# Patient Record
Sex: Male | Born: 1955 | Race: White | Hispanic: No | Marital: Married | Smoking: Former smoker
Health system: Southern US, Community
[De-identification: ages and names within clinical notes are randomized; demographics above are authoritative.]

## PROBLEM LIST (undated history)

## (undated) DIAGNOSIS — G629 Polyneuropathy, unspecified: Secondary | ICD-10-CM

## (undated) DIAGNOSIS — H349 Unspecified retinal vascular occlusion: Secondary | ICD-10-CM

## (undated) DIAGNOSIS — E669 Obesity, unspecified: Secondary | ICD-10-CM

## (undated) DIAGNOSIS — I10 Essential (primary) hypertension: Secondary | ICD-10-CM

## (undated) DIAGNOSIS — Z720 Tobacco use: Secondary | ICD-10-CM

## (undated) DIAGNOSIS — M51369 Other intervertebral disc degeneration, lumbar region without mention of lumbar back pain or lower extremity pain: Secondary | ICD-10-CM

## (undated) DIAGNOSIS — D72829 Elevated white blood cell count, unspecified: Secondary | ICD-10-CM

## (undated) DIAGNOSIS — E785 Hyperlipidemia, unspecified: Secondary | ICD-10-CM

## (undated) DIAGNOSIS — E7800 Pure hypercholesterolemia, unspecified: Secondary | ICD-10-CM

## (undated) DIAGNOSIS — M179 Osteoarthritis of knee, unspecified: Secondary | ICD-10-CM

## (undated) HISTORY — DX: Elevated white blood cell count, unspecified: D72.829

## (undated) HISTORY — DX: Hyperlipidemia, unspecified: E78.5

## (undated) HISTORY — DX: Unspecified retinal vascular occlusion: H34.9

## (undated) HISTORY — PX: BACK SURGERY: SHX140

## (undated) HISTORY — DX: Polyneuropathy, unspecified: G62.9

## (undated) HISTORY — DX: Obesity, unspecified: E66.9

## (undated) HISTORY — DX: Tobacco use: Z72.0

## (undated) HISTORY — DX: Other intervertebral disc degeneration, lumbar region without mention of lumbar back pain or lower extremity pain: M51.369

## (undated) HISTORY — DX: Osteoarthritis of knee, unspecified: M17.9

---

## 2018-03-07 ENCOUNTER — Encounter: Payer: Self-pay | Admitting: Emergency Medicine

## 2018-03-07 ENCOUNTER — Emergency Department: Payer: 59

## 2018-03-07 ENCOUNTER — Other Ambulatory Visit: Payer: Self-pay

## 2018-03-07 ENCOUNTER — Observation Stay
Admission: EM | Admit: 2018-03-07 | Discharge: 2018-03-10 | Disposition: A | Discharge status: Home / Self Care | Payer: 59 | Attending: Internal Medicine | Admitting: Internal Medicine

## 2018-03-07 DIAGNOSIS — G629 Polyneuropathy, unspecified: Secondary | ICD-10-CM | POA: Diagnosis not present

## 2018-03-07 DIAGNOSIS — F17200 Nicotine dependence, unspecified, uncomplicated: Secondary | ICD-10-CM | POA: Diagnosis not present

## 2018-03-07 DIAGNOSIS — Z79899 Other long term (current) drug therapy: Secondary | ICD-10-CM | POA: Insufficient documentation

## 2018-03-07 DIAGNOSIS — I10 Essential (primary) hypertension: Secondary | ICD-10-CM | POA: Diagnosis not present

## 2018-03-07 DIAGNOSIS — Z7982 Long term (current) use of aspirin: Secondary | ICD-10-CM | POA: Diagnosis not present

## 2018-03-07 DIAGNOSIS — G459 Transient cerebral ischemic attack, unspecified: Principal | ICD-10-CM | POA: Diagnosis present

## 2018-03-07 DIAGNOSIS — H547 Unspecified visual loss: Secondary | ICD-10-CM | POA: Diagnosis present

## 2018-03-07 LAB — CBC
HCT: 43.6 % (ref 39.0–52.0)
Hemoglobin: 14.9 g/dL (ref 13.0–17.0)
MCH: 32.7 pg (ref 26.0–34.0)
MCHC: 34.2 g/dL (ref 30.0–36.0)
MCV: 95.6 fL (ref 80.0–100.0)
PLATELETS: 308 10*3/uL (ref 150–400)
RBC: 4.56 MIL/uL (ref 4.22–5.81)
RDW: 12.5 % (ref 11.5–15.5)
WBC: 12.2 10*3/uL — ABNORMAL HIGH (ref 4.0–10.5)
nRBC: 0 % (ref 0.0–0.2)

## 2018-03-07 LAB — COMPREHENSIVE METABOLIC PANEL
ALT: 36 U/L (ref 0–44)
ANION GAP: 9 (ref 5–15)
AST: 23 U/L (ref 15–41)
Albumin: 3.9 g/dL (ref 3.5–5.0)
Alkaline Phosphatase: 52 U/L (ref 38–126)
BUN: 25 mg/dL — ABNORMAL HIGH (ref 8–23)
CHLORIDE: 102 mmol/L (ref 98–111)
CO2: 28 mmol/L (ref 22–32)
Calcium: 9.3 mg/dL (ref 8.9–10.3)
Creatinine, Ser: 0.89 mg/dL (ref 0.61–1.24)
GFR calc non Af Amer: >60 mL/min (ref >60)
Glucose, Bld: 113 mg/dL — ABNORMAL HIGH (ref 70–99)
POTASSIUM: 3.9 mmol/L (ref 3.5–5.1)
SODIUM: 139 mmol/L (ref 135–145)
Total Bilirubin: 0.7 mg/dL (ref 0.3–1.2)
Total Protein: 7.3 g/dL (ref 6.5–8.1)

## 2018-03-07 MED ORDER — ONDANSETRON HCL 4 MG PO TABS: 4.0000 mg | ORAL_TABLET | Freq: Four times a day (QID) | ORAL | Status: DC | PRN

## 2018-03-07 MED ORDER — PREGABALIN 50 MG PO CAPS
100.0000 mg | ORAL_CAPSULE | Freq: Three times a day (TID) | ORAL | Status: DC
  Administered 2018-03-08 – 2018-03-10 (×7): 100 mg via ORAL
  Filled 2018-03-07 (×7): qty 2

## 2018-03-07 MED ORDER — DOCUSATE SODIUM 100 MG PO CAPS
100.0000 mg | ORAL_CAPSULE | Freq: Two times a day (BID) | ORAL | Status: DC
  Administered 2018-03-09: 10:00:00 100 mg via ORAL
  Filled 2018-03-07 (×6): qty 1

## 2018-03-07 MED ORDER — ASPIRIN 325 MG PO TABS
325.0000 mg | ORAL_TABLET | Freq: Every day | ORAL | Status: DC
  Administered 2018-03-08 – 2018-03-09 (×2): 325 mg via ORAL
  Filled 2018-03-07 (×2): qty 1

## 2018-03-07 MED ORDER — ACETAMINOPHEN 650 MG RE SUPP: 650.0000 mg | Freq: Four times a day (QID) | RECTAL | Status: DC | PRN

## 2018-03-07 MED ORDER — ACETAMINOPHEN 325 MG PO TABS: 650.0000 mg | ORAL_TABLET | Freq: Four times a day (QID) | ORAL | Status: DC | PRN

## 2018-03-07 MED ORDER — HEPARIN SODIUM (PORCINE) 5000 UNIT/ML IJ SOLN
5000.0000 [IU] | Freq: Three times a day (TID) | INTRAMUSCULAR | Status: DC
  Administered 2018-03-08 – 2018-03-10 (×6): 5000 [IU] via SUBCUTANEOUS
  Filled 2018-03-07 (×6): qty 1

## 2018-03-07 MED ORDER — VITAMIN C 500 MG PO TABS
500.0000 mg | ORAL_TABLET | Freq: Every day | ORAL | Status: DC
  Administered 2018-03-08 – 2018-03-10 (×3): 500 mg via ORAL
  Filled 2018-03-07 (×3): qty 1

## 2018-03-07 MED ORDER — HYDROCODONE-ACETAMINOPHEN 5-325 MG PO TABS: 1.0000 | ORAL_TABLET | ORAL | Status: DC | PRN

## 2018-03-07 MED ORDER — BISACODYL 5 MG PO TBEC: 5.0000 mg | DELAYED_RELEASE_TABLET | Freq: Every day | ORAL | Status: DC | PRN

## 2018-03-07 MED ORDER — ONDANSETRON HCL 4 MG/2ML IJ SOLN: 4.0000 mg | Freq: Four times a day (QID) | INTRAMUSCULAR | Status: DC | PRN

## 2018-03-07 NOTE — ED Notes (Signed)
Patient transported to CT at this time. 

## 2018-03-07 NOTE — ED Triage Notes (Signed)
Pt presents to ED with vision loss in his right eye. Pt states he got home from work around 1730 and felt tired so he was rubbing his eyes while sitting in his recliner. Pt reports when he stopped he was unable to see out of his right eye. States there is a very small "match head sized area that is clear" but the rest of his visual field is very blurry. Pt reports "it could be clearing up a little bit" and seems to have improved slightly since onset. No other complaints or deficits at this time.

## 2018-03-07 NOTE — ED Provider Notes (Signed)
[facility] Emergency Department Provider Note   ____________________________________________   First MD Initiated Contact with Patient 03/07/18 1936     (approximate)  I have reviewed the triage vital signs and the nursing notes.   HISTORY  Chief Complaint Loss of Vision    HPI Greg Poole is a 62 y.o. malen patient reports about 530 he was rubbing his eye and when he stopped he could not see anything out of the eye except for very small area in the center.  This is gradually began to clear up and now is having just gray spots all around his vision and can only see really well in the center.  He denies any medical problems although review of his old records show that he has hypertension peripheral neuropathy.  He says he is taking his medicines and to take his aspirin 325 daily.   History reviewed. No pertinent past medical history.  There are no active problems to display for this patient.   Past Surgical History:  Procedure Laterality Date  . BACK SURGERY      Prior to Admission medications   Not on File    Allergies Patient has no known allergies.  No family history on file.  Social History Social History   Tobacco Use  . Smoking status: Current Every Day Smoker  . Smokeless tobacco: Never Used  Substance Use Topics  . Alcohol use: Yes  . Drug use: Never    Review of Systems  Constitutional: No fever/chills Eyes:  visual changes. ENT: No sore throat. Cardiovascular: Denies chest pain. Respiratory: Denies shortness of breath. Gastrointestinal: No abdominal pain.  No nausea, no vomiting.  No diarrhea.  No constipation. Genitourinary: Negative for dysuria. Musculoskeletal: Negative for back pain. Skin: Negative for rash. Neurological: Negative for headaches, focal weakness   ____________________________________________   PHYSICAL EXAM:  VITAL SIGNS: ED Triage Vitals  Enc Vitals Group     BP 03/07/18 1922 127/76   Pulse Rate 03/07/18 1922 (!) 56     Resp 03/07/18 1922 20     Temp 03/07/18 1922 (!) 97.5 F (36.4 C)     Temp Source 03/07/18 1922 Oral     SpO2 03/07/18 1922 95 %     Weight 03/07/18 1923 215 lb (97.5 kg)     Height 03/07/18 1923 6' (1.829 m)     Head Circumference --      Peak Flow --      Pain Score 03/07/18 1922 0     Pain Loc --      Pain Edu? --      Excl. in GC? --     Constitutional: Alert and oriented. Well appearing and in no acute distress. Eyes: Conjunctivae are normal. PERRL. EOMI. fundi look normal as well. Head: Atraumatic. Nose: No congestion/rhinnorhea. Mouth/Throat: Mucous membranes are moist.  Oropharynx non-erythematous. Neck: No stridor. Cardiovascular: Normal rate, regular rhythm. Grossly normal heart sounds.  Good peripheral circulation. Respiratory: Normal respiratory effort.  No retractions. Lungs CTAB. Gastrointestinal: Soft and nontender. No distention. No abdominal bruits. No CVA tenderness. Musculoskeletal: No lower extremity tenderness nor edema.   Neurologic:  Normal speech and language. No gross focal neurologic deficits are appreciated structure movements are intact there is no new numbness and no weakness weakness Skin:  Skin is warm, dry and intact. No rash noted. Psychiatric: Mood and affect are normal. Speech and behavior are normal.  ____________________________________________   LABS (all labs ordered are listed, but only abnormal results are  displayed)  Labs Reviewed  CBC - Abnormal; Notable for the following components:      Result Value   WBC 12.2 (*)    All other components within normal limits  COMPREHENSIVE METABOLIC PANEL - Abnormal; Notable for the following components:   Glucose, Bld 113 (*)    BUN 25 (*)    All other components within normal limits   ____________________________________________  EKG  EKG read interpreted by me shows this bradycardia rate of 52 normal axis no acute ST-T  changes ____________________________________________  RADIOLOGY  ED MD interpretation: CT read by radiology does not show any cause for the patient's symptoms  Official radiology report(s): Dg Chest 2 View  Result Date: 03/07/2018 CLINICAL DATA:  Vision loss in right eye since 5 p.m. EXAM: CHEST - 2 VIEW COMPARISON:  None. FINDINGS: The heart size and mediastinal contours are within normal limits. Both lungs are clear. The visualized skeletal structures are unremarkable. IMPRESSION: No active cardiopulmonary disease. Electronically Signed   By: Gerome Sam III M.D   On: 03/07/2018 20:03   Ct Head Wo Contrast  Result Date: 03/07/2018 CLINICAL DATA:  Visual loss in the right eye. EXAM: CT HEAD WITHOUT CONTRAST TECHNIQUE: Contiguous axial images were obtained from the base of the skull through the vertex without intravenous contrast. COMPARISON:  None. FINDINGS: Brain: No evidence of acute infarction, hemorrhage, hydrocephalus, extra-axial collection or mass lesion/mass effect. Minimal small vessel ischemic disease of periventricular white matter, age indeterminate given lack of prior studies for comparison. Vascular: No hyperdense vessel or unexpected calcification. Skull: Normal. Negative for fracture or focal lesion. Sinuses/Orbits: No acute finding. The globes are incompletely imaged on this head CT. That which is included is unremarkable. Other: None. IMPRESSION: Minimal small vessel ischemic disease periventricular white matter. No acute intracranial abnormality. No CT findings to explain the patient's right-sided visual loss. Electronically Signed   By: Tollie Eth M.D.   On: 03/07/2018 20:21    ____________________________________________   PROCEDURES  Procedure(s) performed:   Procedures  Critical Care performed:   ____________________________________________   INITIAL IMPRESSION / ASSESSMENT AND PLAN / ED COURSE  Patient with apparent amaurosis fugax.  This is  essentially a TIA we will admit him.  Is already taken aspirin today.         ____________________________________________   FINAL CLINICAL IMPRESSION(S) / ED DIAGNOSES  Final diagnoses:  TIA (transient ischemic attack)     ED Discharge Orders    None       Note:  This document was prepared using Dragon voice recognition software and may include unintentional dictation errors.     Arnaldo Natal, MD 03/07/18 2105

## 2018-03-07 NOTE — ED Notes (Signed)
Pt reports already taking his daily aspirin for today, so additional dose not indicated at this time.

## 2018-03-07 NOTE — ED Notes (Signed)
Pt returned to ED Rm 19 from CT at this time. 

## 2018-03-07 NOTE — H&P (Signed)
[facility] [place] [facility]   PATIENT NAME: Greg AndersonRobert Poole    MR#:  [id number]  DATE OF BIRTH:  07-04-1955  DATE OF ADMISSION:  03/07/2018  PRIMARY CARE PHYSICIAN: Patient, No Pcp Per   REQUESTING/REFERRING PHYSICIAN:   CHIEF COMPLAINT:   Chief Complaint  Patient presents with  . Loss of Vision    HISTORY OF PRESENT ILLNESS: Greg Poole  is a 62 y.o. male with a known history of tobacco abuse, hypertension, peripheral neuropathy. He presented to the hospital for sudden onset of painless vision loss to the right eye.  He states that around 5:30 PM, after he was rubbing his eye, he could not see with the right eye except for a very small area in the center.  This has gradually began to clear up and now patient is only having a small gray spot medial to the center of the eye.  No reports of headache, weakness, numbness or tingling.  No similar episodes in the past.  He is compliant with his medications including blood pressure medications and aspirin daily.  No new medications, no illicit drugs use.  He has been smoking daily for approximately 40 years. Brain CT done in emergency room is negative for any acute findings. Blood test done emergency room, including CBC and CMP are grossly unremarkable, except for slightly elevated WBC at 12.2. Is admitted for further evaluation and treatment.  PAST MEDICAL HISTORY: Hypertension, peripheral neuropathy, tobacco abuse.  PAST SURGICAL HISTORY:  Past Surgical History:  Procedure Laterality Date  . BACK SURGERY      SOCIAL HISTORY:  Social History   Tobacco Use  . Smoking status: Current Every Day Smoker  . Smokeless tobacco: Never Used  Substance Use Topics  . Alcohol use: Yes    FAMILY HISTORY: HTN in both parents.  DRUG ALLERGIES: No Known Allergies  REVIEW OF SYSTEMS:   CONSTITUTIONAL: No fever, fatigue or weakness.  EYES: Right eye vision loss.  EARS, NOSE, AND THROAT: No tinnitus or ear  pain.  RESPIRATORY: No cough, shortness of breath, wheezing or hemoptysis.  CARDIOVASCULAR: No chest pain, orthopnea, edema.  GASTROINTESTINAL: No nausea, vomiting, diarrhea or abdominal pain.  GENITOURINARY: No dysuria, hematuria.  ENDOCRINE: No polyuria, nocturia. HEMATOLOGY: No bleeding. SKIN: No rash or lesion. MUSCULOSKELETAL: No joint pain at this time.   NEUROLOGIC: No focal weakness.  PSYCHIATRY: No anxiety or depression.   MEDICATIONS AT HOME:  Prior to Admission medications   Medication Sig Start Date End Date Taking? Authorizing Provider  amLODipine (NORVASC) 5 MG tablet Take 5 mg by mouth daily.   Yes [provider]  aspirin 325 MG tablet Take 1 tablet by mouth daily.   Yes [provider]  bisoprolol-hydrochlorothiazide (ZIAC) 5-6.25 MG tablet Take 1 tablet by mouth daily.   Yes [provider]  vitamin C (ASCORBIC ACID) 500 MG tablet Take 500 mg by mouth daily.   Yes [provider]  pregabalin (LYRICA) 100 MG capsule Take 100 mg by mouth 3 (three) times daily. 02/09/18   [provider]      PHYSICAL EXAMINATION:   VITAL SIGNS: Blood pressure 126/70, pulse (!) 52, temperature (!) 97.5 F (36.4 C), temperature source Oral, resp. rate 17, height 6' (1.829 m), weight 97.5 kg, SpO2 97 %.  GENERAL:  62 y.o.-year-old patient lying in the bed with no acute distress.  EYES: Pupils equal, round, reactive to light and accommodation. No scleral icterus. Extraocular muscles intact.  HEENT: Head  atraumatic, normocephalic. Oropharynx and nasopharynx clear.  NECK:  Supple, no jugular venous distention. No thyroid enlargement, no tenderness.  LUNGS: Normal breath sounds bilaterally, no wheezing, rales,rhonchi or crepitation. No use of accessory muscles of respiration.  CARDIOVASCULAR: S1, S2 normal. No S3/S4.  ABDOMEN: Soft, nontender, nondistended. Bowel sounds present. No organomegaly or mass.  EXTREMITIES: No pedal edema, cyanosis, or  clubbing.  NEUROLOGIC: Cranial nerves II through XII are intact. Muscle strength 5/5 in all extremities. Sensation intact.   PSYCHIATRIC: The patient is alert and oriented x 3.  SKIN: No obvious rash, lesion, or ulcer.   LABORATORY PANEL:   CBC Recent Labs  Lab 03/07/18 2007  WBC 12.2*  HGB 14.9  HCT 43.6  PLT 308  MCV 95.6  MCH 32.7  MCHC 34.2  RDW 12.5   ------------------------------------------------------------------------------------------------------------------  Chemistries  Recent Labs  Lab 03/07/18 2007  NA 139  K 3.9  CL 102  CO2 28  GLUCOSE 113*  BUN 25*  CREATININE 0.89  CALCIUM 9.3  AST 23  ALT 36  ALKPHOS 52  BILITOT 0.7   ------------------------------------------------------------------------------------------------------------------ estimated creatinine clearance is 105.5 mL/min (by C-G formula based on SCr of 0.89 mg/dL). ------------------------------------------------------------------------------------------------------------------ No results for input(s): TSH, T4TOTAL, T3FREE, THYROIDAB in the last 72 hours.  Invalid input(s): FREET3   Coagulation profile No results for input(s): INR, PROTIME in the last 168 hours. ------------------------------------------------------------------------------------------------------------------- No results for input(s): DDIMER in the last 72 hours. -------------------------------------------------------------------------------------------------------------------  Cardiac Enzymes No results for input(s): CKMB, TROPONINI, MYOGLOBIN in the last 168 hours.  Invalid input(s): CK ------------------------------------------------------------------------------------------------------------------ Invalid input(s): POCBNP  ---------------------------------------------------------------------------------------------------------------  Urinalysis No results found for: COLORURINE, APPEARANCEUR, LABSPEC, PHURINE,  GLUCOSEU, HGBUR, BILIRUBINUR, KETONESUR, PROTEINUR, UROBILINOGEN, NITRITE, LEUKOCYTESUR   RADIOLOGY: Dg Chest 2 View  Result Date: 03/07/2018 CLINICAL DATA:  Vision loss in right eye since 5 p.m. EXAM: CHEST - 2 VIEW COMPARISON:  None. FINDINGS: The heart size and mediastinal contours are within normal limits. Both lungs are clear. The visualized skeletal structures are unremarkable. IMPRESSION: No active cardiopulmonary disease. Electronically Signed   By: Gerome Sam III M.D   On: 03/07/2018 20:03   Ct Head Wo Contrast  Result Date: 03/07/2018 CLINICAL DATA:  Visual loss in the right eye. EXAM: CT HEAD WITHOUT CONTRAST TECHNIQUE: Contiguous axial images were obtained from the base of the skull through the vertex without intravenous contrast. COMPARISON:  None. FINDINGS: Brain: No evidence of acute infarction, hemorrhage, hydrocephalus, extra-axial collection or mass lesion/mass effect. Minimal small vessel ischemic disease of periventricular white matter, age indeterminate given lack of prior studies for comparison. Vascular: No hyperdense vessel or unexpected calcification. Skull: Normal. Negative for fracture or focal lesion. Sinuses/Orbits: No acute finding. The globes are incompletely imaged on this head CT. That which is included is unremarkable. Other: None. IMPRESSION: Minimal small vessel ischemic disease periventricular white matter. No acute intracranial abnormality. No CT findings to explain the patient's right-sided visual loss. Electronically Signed   By: Tollie Eth M.D.   On: 03/07/2018 20:21    EKG: Orders placed or performed during the hospital encounter of 03/07/18  . ED EKG  . ED EKG  . EKG 12-Lead  . EKG 12-Lead  . EKG 12-Lead  . EKG 12-Lead  . EKG 12-Lead  . EKG 12-Lead    IMPRESSION AND PLAN:   1.  Right eye transient, partial vision loss.  Will rule out TIA.  Continue aspirin.  Will check brain MRI, carotid Doppler and 2D echo.  Will check lipid panel  and  A1c. 2.  HTN, well-controlled, resume home medications. 3.  Tobacco abuse and dependence.  Smoking cessation was discussed with patient in detail. 4.  Peripheral neuropathy, on Lyrica.  All the records are reviewed and case discussed with ED provider. Management plans discussed with the patient, family and they are in agreement.  CODE STATUS: Full    TOTAL TIME TAKING CARE OF THIS PATIENT:50 minutes.    Cammy Copa M.D on 03/07/2018 at 11:06 PM  Between 7am to 6pm - Pager - [phone]  After 6pm go to [email or web address] - password [email or web address] [facility] [place] [facility]  [phone]  CC: Primary care physician; Patient, No Pcp Per

## 2018-03-08 ENCOUNTER — Other Ambulatory Visit: Payer: Self-pay

## 2018-03-08 ENCOUNTER — Inpatient Hospital Stay: Payer: 59

## 2018-03-08 ENCOUNTER — Inpatient Hospital Stay
Admit: 2018-03-08 | Discharge: 2018-03-08 | Disposition: A | Discharge status: Home / Self Care | Payer: 59 | Attending: Internal Medicine | Admitting: Internal Medicine

## 2018-03-08 DIAGNOSIS — G459 Transient cerebral ischemic attack, unspecified: Secondary | ICD-10-CM | POA: Diagnosis not present

## 2018-03-08 LAB — CBC
HCT: 41.2 % (ref 39.0–52.0)
HEMOGLOBIN: 13.9 g/dL (ref 13.0–17.0)
MCH: 32.5 pg (ref 26.0–34.0)
MCHC: 33.7 g/dL (ref 30.0–36.0)
MCV: 96.3 fL (ref 80.0–100.0)
Platelets: 277 10*3/uL (ref 150–400)
RBC: 4.28 MIL/uL (ref 4.22–5.81)
RDW: 12.5 % (ref 11.5–15.5)
WBC: 9 10*3/uL (ref 4.0–10.5)
nRBC: 0 % (ref 0.0–0.2)

## 2018-03-08 LAB — LIPID PANEL
Cholesterol: 202 mg/dL — ABNORMAL HIGH (ref 0–200)
HDL: 48 mg/dL (ref >40)
LDL CALC: 132 mg/dL — AB (ref 0–99)
Total CHOL/HDL Ratio: 4.2 RATIO
Triglycerides: 111 mg/dL (ref <150)
VLDL: 22 mg/dL (ref 0–40)

## 2018-03-08 LAB — BASIC METABOLIC PANEL
ANION GAP: 10 (ref 5–15)
BUN: 19 mg/dL (ref 8–23)
CHLORIDE: 106 mmol/L (ref 98–111)
CO2: 24 mmol/L (ref 22–32)
Calcium: 8.8 mg/dL — ABNORMAL LOW (ref 8.9–10.3)
Creatinine, Ser: 0.56 mg/dL — ABNORMAL LOW (ref 0.61–1.24)
GFR calc Af Amer: >60 mL/min (ref >60)
GLUCOSE: 102 mg/dL — AB (ref 70–99)
POTASSIUM: 3.9 mmol/L (ref 3.5–5.1)
SODIUM: 140 mmol/L (ref 135–145)

## 2018-03-08 LAB — GLUCOSE, CAPILLARY: GLUCOSE-CAPILLARY: 108 mg/dL — AB (ref 70–99)

## 2018-03-08 LAB — HEMOGLOBIN A1C
Hgb A1c MFr Bld: 5.4 % (ref 4.8–5.6)
Mean Plasma Glucose: 108.28 mg/dL

## 2018-03-08 LAB — VITAMIN B12: VITAMIN B 12: 995 pg/mL — AB (ref 180–914)

## 2018-03-08 LAB — TSH: TSH: 2.11 u[IU]/mL (ref 0.350–4.500)

## 2018-03-08 MED ORDER — CLOPIDOGREL BISULFATE 75 MG PO TABS: 75.0000 mg | ORAL_TABLET | Freq: Every day | ORAL | 0 refills | Status: AC

## 2018-03-08 MED ORDER — ATORVASTATIN CALCIUM 20 MG PO TABS
40.0000 mg | ORAL_TABLET | Freq: Every day | ORAL | Status: DC
  Administered 2018-03-08 – 2018-03-09 (×2): 40 mg via ORAL
  Filled 2018-03-08 (×2): qty 2

## 2018-03-08 MED ORDER — HALOPERIDOL LACTATE 5 MG/ML IJ SOLN: 2.0000 mg | Freq: Four times a day (QID) | INTRAMUSCULAR | Status: DC | PRN

## 2018-03-08 MED ORDER — ATORVASTATIN CALCIUM 40 MG PO TABS: 40.0000 mg | ORAL_TABLET | Freq: Every day | ORAL | 0 refills | Status: DC

## 2018-03-08 NOTE — Plan of Care (Signed)
  Problem: Education: Goal: Knowledge of General Education information will improve Description Including pain rating scale, medication(s)/side effects and non-pharmacologic comfort measures Outcome: Progressing   Problem: Elimination: Goal: Will not experience complications related to urinary retention Outcome: Progressing   Problem: Pain Managment: Goal: General experience of comfort will improve Outcome: Progressing   Problem: Safety: Goal: Ability to remain free from injury will improve Outcome: Progressing   Problem: Education: Goal: Knowledge of disease or condition will improve Outcome: Progressing   Problem: Ischemic Stroke/TIA Tissue Perfusion: Goal: Complications of ischemic stroke/TIA will be minimized Outcome: Progressing

## 2018-03-08 NOTE — Discharge Summary (Deleted)
[facility] [place] [facility]   PATIENT NAME: Greg Poole    MR#:  [id number]  DATE OF BIRTH:  Jul 05, 1955  DATE OF ADMISSION:  03/07/2018   ADMITTING PHYSICIAN: Greg Copa, MD  DATE OF DISCHARGE: 03/08/18  PRIMARY CARE PHYSICIAN: Greg Poole   ADMISSION DIAGNOSIS:  TIA (transient ischemic attack) [G45.9] DISCHARGE DIAGNOSIS:  Active Problems:   TIA (transient ischemic attack)  SECONDARY DIAGNOSIS:  History reviewed. No pertinent past medical history. HOSPITAL COURSE:   Greg Poole is a 62 year old male with no past medical history who presented to the ED with painless right eye vision loss.  He described the vision loss as "not being able to see except for a small pinpoint spot in the middle of his eye".  He was admitted for stroke rule out.  Painless right eye vision loss- due to TIA vs ophthalmologic issue. -Initially unable to see anything out of right eye, except for pinpoint area in the center. Vision improved on the day of discharge and patient now just having small grey spots throughout visual field -MRI brain negative for acute stroke -Carotid dopplers unremarkable -ECHO ** -A1c 5.4%, LDL elevated to 132 -Started on Lipitor 40mg  daily -Seen by neurology, who recommended switching from aspirin to plavix -Urgent outpatient follow-up appointment scheduled with ophthalmology  Hypertension- BPs well-controlled -Continued home BP meds  Tobacco use- smoking cessation counseling provided  Peripheral neuropathy- continued lyrica  DISCHARGE CONDITIONS:  Painless right eye vision loss Hypertension Tobacco use Peripheral neuropathy CONSULTS OBTAINED:  Treatment Team:  Greg Groom, MD DRUG ALLERGIES:  No Known Allergies DISCHARGE MEDICATIONS:   Allergies as of 03/08/2018   No Known Allergies     Medication List    STOP taking these medications   aspirin 325 MG tablet     TAKE these medications   amLODipine 5 MG  tablet Commonly known as:  NORVASC Take 5 mg by mouth daily.   atorvastatin 40 MG tablet Commonly known as:  LIPITOR Take 1 tablet (40 mg total) by mouth daily at 6 PM.   bisoprolol-hydrochlorothiazide 5-6.25 MG tablet Commonly known as:  ZIAC Take 1 tablet by mouth daily.   clopidogrel 75 MG tablet Commonly known as:  PLAVIX Take 1 tablet (75 mg total) by mouth daily.   pregabalin 100 MG capsule Commonly known as:  LYRICA Take 100 mg by mouth 3 (three) times daily.   vitamin C 500 MG tablet Commonly known as:  ASCORBIC ACID Take 500 mg by mouth daily.        DISCHARGE INSTRUCTIONS:  1.  Follow-up with PCP in 1 to 2 weeks 2.  Follow-up with ophthalmology in the next couple of days 3.  Switched from aspirin to Plavix Poole neurology recommendations 4.  Started on Lipitor DIET:  Cardiac diet DISCHARGE CONDITION:  Stable ACTIVITY:  Activity as tolerated OXYGEN:  Home Oxygen: Yes.    Oxygen Delivery: room air DISCHARGE LOCATION:  home   If you experience worsening of your admission symptoms, develop shortness of breath, life threatening emergency, suicidal or homicidal thoughts you must seek medical attention immediately by calling 911 or calling your MD immediately  if symptoms less severe.  You Must read complete instructions/literature along with all the possible adverse reactions/side effects for all the Medicines you take and that have been prescribed to you. Take any new Medicines after you have completely understood and accpet all the possible adverse reactions/side effects.   Please note  You were cared for  by a hospitalist during your hospital stay. If you have any questions about your discharge medications or the care you received while you were in the hospital after you are discharged, you can call the unit and asked to speak with the hospitalist on call if the hospitalist that took care of you is not available. Once you are discharged, your primary care  physician will handle any further medical issues. Please note that NO REFILLS for any discharge medications will be authorized once you are discharged, as it is imperative that you return to your primary care physician (or establish a relationship with a primary care physician if you do not have one) for your aftercare needs so that they can reassess your need for medications and monitor your lab values.    On the day of Discharge:  VITAL SIGNS:  Blood pressure (!) 150/79, pulse 62, temperature 98.1 F (36.7 C), temperature source Oral, resp. rate 20, height 6' (1.829 m), weight 97.5 kg, SpO2 98 %. PHYSICAL EXAMINATION:  GENERAL:  62 y.o.-year-old patient lying in the bed with no acute distress.  EYES: Pupils equal, round, reactive to light and accommodation. No scleral icterus. Extraocular muscles intact.  HEENT: Head atraumatic, normocephalic. Oropharynx and nasopharynx clear.  NECK:  Supple, no jugular venous distention. No thyroid enlargement, no tenderness.  LUNGS: Normal breath sounds bilaterally, no wheezing, rales,rhonchi or crepitation. No use of accessory muscles of respiration.  CARDIOVASCULAR: S1, S2 normal. No murmurs, rubs, or gallops.  ABDOMEN: Soft, non-tender, non-distended. Bowel sounds present. No organomegaly or mass.  EXTREMITIES: No pedal edema, cyanosis, or clubbing.  NEUROLOGIC: Cranial nerves II through XII are intact. Muscle strength 5/5 in all extremities. Sensation intact. Gait not checked.  PSYCHIATRIC: The patient is alert and oriented x 3.  SKIN: No obvious rash, lesion, or ulcer.  DATA REVIEW:   CBC Recent Labs  Lab 03/08/18 0512  WBC 9.0  HGB 13.9  HCT 41.2  PLT 277    Chemistries  Recent Labs  Lab 03/07/18 2007 03/08/18 0512  NA 139 140  K 3.9 3.9  CL 102 106  CO2 28 24  GLUCOSE 113* 102*  BUN 25* 19  CREATININE 0.89 0.56*  CALCIUM 9.3 8.8*  AST 23  --   ALT 36  --   ALKPHOS 52  --   BILITOT 0.7  --      Microbiology Results  No  results found for this or any previous visit.  RADIOLOGY:  Dg Chest 2 View  Result Date: 03/07/2018 CLINICAL DATA:  Vision loss in right eye since 5 p.m. EXAM: CHEST - 2 VIEW COMPARISON:  None. FINDINGS: The heart size and mediastinal contours are within normal limits. Both lungs are clear. The visualized skeletal structures are unremarkable. IMPRESSION: No active cardiopulmonary disease. Electronically Signed   By: Gerome Sam III M.D   On: 03/07/2018 20:03   Ct Head Wo Contrast  Result Date: 03/07/2018 CLINICAL DATA:  Visual loss in the right eye. EXAM: CT HEAD WITHOUT CONTRAST TECHNIQUE: Contiguous axial images were obtained from the base of the skull through the vertex without intravenous contrast. COMPARISON:  None. FINDINGS: Brain: No evidence of acute infarction, hemorrhage, hydrocephalus, extra-axial collection or mass lesion/mass effect. Minimal small vessel ischemic disease of periventricular white matter, age indeterminate given lack of prior studies for comparison. Vascular: No hyperdense vessel or unexpected calcification. Skull: Normal. Negative for fracture or focal lesion. Sinuses/Orbits: No acute finding. The globes are incompletely imaged on this head CT. That which  is included is unremarkable. Other: None. IMPRESSION: Minimal small vessel ischemic disease periventricular white matter. No acute intracranial abnormality. No CT findings to explain the patient's right-sided visual loss. Electronically Signed   By: Tollie Ethavid  Kwon M.D.   On: 03/07/2018 20:21   Mr Brain Wo Contrast  Result Date: 03/08/2018 CLINICAL DATA:  RIGHT vision loss since yesterday. History of hypertension. EXAM: MRI HEAD WITHOUT CONTRAST TECHNIQUE: Multiplanar, multiecho pulse sequences of the brain and surrounding structures were obtained without intravenous contrast. COMPARISON:  CT HEAD March 07, 2018 FINDINGS: INTRACRANIAL CONTENTS: No reduced diffusion to suggest acute ischemia. No susceptibility artifact  to suggest hemorrhage. The ventricles and sulci are normal for patient's age. Patchy supratentorial white matter FLAIR T2 hyperintensities. No suspicious parenchymal signal, masses, mass effect. No abnormal extra-axial fluid collections. No extra-axial masses. VASCULAR: Normal major intracranial vascular flow voids present at skull base. SKULL AND UPPER CERVICAL SPINE: No abnormal sellar expansion. No suspicious calvarial bone marrow signal. Craniocervical junction maintained. SINUSES/ORBITS: The mastoid air-cells and included paranasal sinuses are well-aerated.The included ocular globes and orbital contents are non-suspicious. OTHER: None. IMPRESSION: 1. No acute intracranial process. 2. Mild-to-moderate chronic small vessel ischemic changes. Electronically Signed   By: Awilda Metroourtnay  Bloomer M.D.   On: 03/08/2018 04:09   [place] Carotid Bilateral  Result Date: 03/08/2018 CLINICAL DATA:  62 year old male with a history TIA EXAM: BILATERAL CAROTID DUPLEX ULTRASOUND TECHNIQUE: Wallace CullensGray scale imaging, color Doppler and duplex ultrasound were performed of bilateral carotid and vertebral arteries in the neck. COMPARISON:  None. FINDINGS: Criteria: Quantification of carotid stenosis is based on velocity parameters that correlate the residual internal carotid diameter with NASCET-based stenosis levels, using the diameter of the distal internal carotid lumen as the denominator for stenosis measurement. The following velocity measurements were obtained: RIGHT ICA:  Systolic 73 cm/sec, Diastolic 11 cm/sec CCA:  73 cm/sec SYSTOLIC ICA/CCA RATIO:  1.0 ECA:  89 cm/sec LEFT ICA:  Systolic 72 cm/sec, Diastolic 32 cm/sec CCA:  85 cm/sec SYSTOLIC ICA/CCA RATIO:  0.9 ECA:  94 cm/sec Right Brachial SBP: Not acquired Left Brachial SBP: Not acquired RIGHT CAROTID ARTERY: No significant calcifications of the right common carotid artery. Intermediate waveform maintained. Heterogeneous and partially calcified plaque at the right carotid  bifurcation. No significant lumen shadowing. Low resistance waveform of the right ICA. No significant tortuosity. RIGHT VERTEBRAL ARTERY: Antegrade flow with low resistance waveform. LEFT CAROTID ARTERY: No significant calcifications of the left common carotid artery. Intermediate waveform maintained. Heterogeneous and partially calcified plaque at the left carotid bifurcation without significant lumen shadowing. Low resistance waveform of the left ICA. No significant tortuosity. LEFT VERTEBRAL ARTERY:  Antegrade flow with low resistance waveform. IMPRESSION: Color duplex indicates minimal heterogeneous and calcified plaque, with no hemodynamically significant stenosis by duplex criteria in the extracranial cerebrovascular circulation. Signed, Yvone NeuJaime S. Reyne DumasWagner, DO, RPVI [facility] Electronically Signed   By: Gilmer MorJaime  Wagner D.O.   On: 03/08/2018 12:27     Management plans discussed with the patient, family and they are in agreement.  CODE STATUS: Full Code   TOTAL TIME TAKING CARE OF THIS PATIENT: 35 minutes.    Jinny BlossomKaty D Mayo M.D on 03/08/2018 at 1:43 PM  Between 7am to 6pm - Pager - [phone]  After 6pm go to [email or web address] - Social research officer, governmentpassword EPAS [facility]  [facility] [place] [facility]  Office  [phone]  CC: Primary care physician; Greg Poole   Note: This dictation was prepared with Dragon dictation along with smaller  Company secretary. Any transcriptional errors that result from this process are unintentional.

## 2018-03-08 NOTE — Progress Notes (Signed)
*  PRELIMINARY RESULTS* Echocardiogram 2D Echocardiogram has been performed.  Joanette GulaJoan M Muhammadali Ries 03/08/2018, 9:07 AM

## 2018-03-08 NOTE — Plan of Care (Signed)
  Problem: Education: Goal: Knowledge of General Education information will improve Description Including pain rating scale, medication(s)/side effects and non-pharmacologic comfort measures Outcome: Progressing   Problem: Health Behavior/Discharge Planning: Goal: Ability to manage health-related needs will improve Outcome: Progressing   Problem: Clinical Measurements: Goal: Ability to maintain clinical measurements within normal limits will improve Outcome: Progressing Goal: Will remain free from infection Outcome: Progressing Goal: Cardiovascular complication will be avoided Outcome: Progressing   Problem: Nutrition: Goal: Adequate nutrition will be maintained Outcome: Progressing   Problem: Elimination: Goal: Will not experience complications related to bowel motility Outcome: Progressing Goal: Will not experience complications related to urinary retention Outcome: Progressing   Problem: Pain Managment: Goal: General experience of comfort will improve Outcome: Progressing   Problem: Safety: Goal: Ability to remain free from injury will improve Outcome: Progressing   Problem: Skin Integrity: Goal: Risk for impaired skin integrity will decrease Outcome: Progressing   Problem: Education: Goal: Knowledge of disease or condition will improve Outcome: Progressing Goal: Knowledge of secondary prevention will improve Outcome: Progressing Goal: Knowledge of patient specific risk factors addressed and post discharge goals established will improve Outcome: Progressing   Problem: Ischemic Stroke/TIA Tissue Perfusion: Goal: Complications of ischemic stroke/TIA will be minimized Outcome: Progressing

## 2018-03-08 NOTE — Progress Notes (Addendum)
[facility] - [place] [facility]   PATIENT NAME: Greg Poole    MR#:  [id number]  DATE OF BIRTH:  01-Feb-1956  SUBJECTIVE:   States that his vision has improved. Now seeing small grey dots throughout his vision. No headaches.  REVIEW OF SYSTEMS:  Review of Systems  Constitutional: Negative for chills and fever.  HENT: Negative for congestion and sore throat.   Eyes: Negative for blurred vision, double vision, photophobia and pain.  Respiratory: Negative for cough and shortness of breath.   Cardiovascular: Negative for chest pain, palpitations and leg swelling.  Gastrointestinal: Negative for abdominal pain, nausea and vomiting.  Genitourinary: Negative for dysuria, frequency and urgency.  Musculoskeletal: Negative for back pain and neck pain.  Neurological: Negative for dizziness and headaches.  Psychiatric/Behavioral: Negative for depression. The patient is not nervous/anxious.     DRUG ALLERGIES:  No Known Allergies VITALS:  Blood pressure (!) 150/79, pulse 62, temperature 98.1 F (36.7 C), temperature source Oral, resp. rate 20, height 6' (1.829 m), weight 97.5 kg, SpO2 98 %. PHYSICAL EXAMINATION:  Physical Exam  GENERAL:  62 y.o.-year-old patient lying in the bed with no acute distress.  EYES: Pupils equal, round, reactive to light and accommodation. No scleral icterus. Extraocular muscles intact.  HEENT: Head atraumatic, normocephalic. Oropharynx and nasopharynx clear.  NECK:  Supple, no jugular venous distention. No thyroid enlargement, no tenderness.  LUNGS: Normal breath sounds bilaterally, no wheezing, rales,rhonchi or crepitation. No use of accessory muscles of respiration.  CARDIOVASCULAR: S1, S2 normal. No S3/S4.  ABDOMEN: Soft, nontender, nondistended. Bowel sounds present. No organomegaly or mass.  EXTREMITIES: No pedal edema, cyanosis, or clubbing.  NEUROLOGIC: Cranial nerves II through XII are intact. Muscle strength 5/5 in all  extremities. Sensation intact.   PSYCHIATRIC: The patient is alert and oriented x 3.  SKIN: No obvious rash, lesion, or ulcer. LABORATORY PANEL:  Male CBC Recent Labs  Lab 03/08/18 0512  WBC 9.0  HGB 13.9  HCT 41.2  PLT 277   ------------------------------------------------------------------------------------------------------------------ Chemistries  Recent Labs  Lab 03/07/18 2007 03/08/18 0512  NA 139 140  K 3.9 3.9  CL 102 106  CO2 28 24  GLUCOSE 113* 102*  BUN 25* 19  CREATININE 0.89 0.56*  CALCIUM 9.3 8.8*  AST 23  --   ALT 36  --   ALKPHOS 52  --   BILITOT 0.7  --    RADIOLOGY:  Dg Chest 2 View  Result Date: 03/07/2018 CLINICAL DATA:  Vision loss in right eye since 5 p.m. EXAM: CHEST - 2 VIEW COMPARISON:  None. FINDINGS: The heart size and mediastinal contours are within normal limits. Both lungs are clear. The visualized skeletal structures are unremarkable. IMPRESSION: No active cardiopulmonary disease. Electronically Signed   By: Gerome Sam III M.D   On: 03/07/2018 20:03   Ct Head Wo Contrast  Result Date: 03/07/2018 CLINICAL DATA:  Visual loss in the right eye. EXAM: CT HEAD WITHOUT CONTRAST TECHNIQUE: Contiguous axial images were obtained from the base of the skull through the vertex without intravenous contrast. COMPARISON:  None. FINDINGS: Brain: No evidence of acute infarction, hemorrhage, hydrocephalus, extra-axial collection or mass lesion/mass effect. Minimal small vessel ischemic disease of periventricular white matter, age indeterminate given lack of prior studies for comparison. Vascular: No hyperdense vessel or unexpected calcification. Skull: Normal. Negative for fracture or focal lesion. Sinuses/Orbits: No acute finding. The globes are incompletely imaged on this head CT. That which is included is unremarkable. Other:  None. IMPRESSION: Minimal small vessel ischemic disease periventricular white matter. No acute intracranial abnormality. No CT  findings to explain the patient's right-sided visual loss. Electronically Signed   By: Tollie Eth M.D.   On: 03/07/2018 20:21   Mr Brain Wo Contrast  Result Date: 03/08/2018 CLINICAL DATA:  RIGHT vision loss since yesterday. History of hypertension. EXAM: MRI HEAD WITHOUT CONTRAST TECHNIQUE: Multiplanar, multiecho pulse sequences of the brain and surrounding structures were obtained without intravenous contrast. COMPARISON:  CT HEAD March 07, 2018 FINDINGS: INTRACRANIAL CONTENTS: No reduced diffusion to suggest acute ischemia. No susceptibility artifact to suggest hemorrhage. The ventricles and sulci are normal for patient's age. Patchy supratentorial white matter FLAIR T2 hyperintensities. No suspicious parenchymal signal, masses, mass effect. No abnormal extra-axial fluid collections. No extra-axial masses. VASCULAR: Normal major intracranial vascular flow voids present at skull base. SKULL AND UPPER CERVICAL SPINE: No abnormal sellar expansion. No suspicious calvarial bone marrow signal. Craniocervical junction maintained. SINUSES/ORBITS: The mastoid air-cells and included paranasal sinuses are well-aerated.The included ocular globes and orbital contents are non-suspicious. OTHER: None. IMPRESSION: 1. No acute intracranial process. 2. Mild-to-moderate chronic small vessel ischemic changes. Electronically Signed   By: Awilda Metro M.D.   On: 03/08/2018 04:09   [place] Carotid Bilateral  Result Date: 03/08/2018 CLINICAL DATA:  62 year old male with a history TIA EXAM: BILATERAL CAROTID DUPLEX ULTRASOUND TECHNIQUE: Wallace Cullens scale imaging, color Doppler and duplex ultrasound were performed of bilateral carotid and vertebral arteries in the neck. COMPARISON:  None. FINDINGS: Criteria: Quantification of carotid stenosis is based on velocity parameters that correlate the residual internal carotid diameter with NASCET-based stenosis levels, using the diameter of the distal internal carotid lumen as the  denominator for stenosis measurement. The following velocity measurements were obtained: RIGHT ICA:  Systolic 73 cm/sec, Diastolic 11 cm/sec CCA:  73 cm/sec SYSTOLIC ICA/CCA RATIO:  1.0 ECA:  89 cm/sec LEFT ICA:  Systolic 72 cm/sec, Diastolic 32 cm/sec CCA:  85 cm/sec SYSTOLIC ICA/CCA RATIO:  0.9 ECA:  94 cm/sec Right Brachial SBP: Not acquired Left Brachial SBP: Not acquired RIGHT CAROTID ARTERY: No significant calcifications of the right common carotid artery. Intermediate waveform maintained. Heterogeneous and partially calcified plaque at the right carotid bifurcation. No significant lumen shadowing. Low resistance waveform of the right ICA. No significant tortuosity. RIGHT VERTEBRAL ARTERY: Antegrade flow with low resistance waveform. LEFT CAROTID ARTERY: No significant calcifications of the left common carotid artery. Intermediate waveform maintained. Heterogeneous and partially calcified plaque at the left carotid bifurcation without significant lumen shadowing. Low resistance waveform of the left ICA. No significant tortuosity. LEFT VERTEBRAL ARTERY:  Antegrade flow with low resistance waveform. IMPRESSION: Color duplex indicates minimal heterogeneous and calcified plaque, with no hemodynamically significant stenosis by duplex criteria in the extracranial cerebrovascular circulation. Signed, Yvone Neu. Reyne Dumas, RPVI [facility] Electronically Signed   By: Gilmer Mor D.O.   On: 03/08/2018 12:27   ASSESSMENT AND PLAN:   Painless right eye vision loss- due to TIA vs ophthalmologic issue. -Initially unable to see anything out of right eye, except for pinpoint area in the center. Vision improved on the day of discharge and patient now just having small grey spots throughout visual field -MRI brain negative for acute stroke -Carotid dopplers unremarkable -ECHO pending -A1c 5.4%, LDL elevated to 132 -Started on Lipitor 40mg  daily -Seen by  neurology, who recommended switching from aspirin to plavix, may need TEE based on TTE results -Cardiac monitoring -Needs ophthalmology appointment as outpatient -  PT/OT/SLP consult not medically necessary as patient has no physical deficits or swallowing concerns.  Hypertension- BPs well-controlled -Continue home BP meds  Tobacco use- smoking cessation counseling provided  Peripheral neuropathy- continued lyrica  All the records are reviewed and case discussed with Care Management/Social Worker. Management plans discussed with the patient, family and they are in agreement.  CODE STATUS: Full Code  TOTAL TIME TAKING CARE OF THIS PATIENT: 40 minutes.   More than 50% of the time was spent in counseling/coordination of care: YES  POSSIBLE D/C IN 1-2 DAYS, DEPENDING ON CLINICAL CONDITION.   Jinny BlossomKaty D Lavene Penagos M.D on 03/08/2018 at 4:49 PM  Between 7am to 6pm - Pager - [phone]  After 6pm go to [email or web address] - Social research officer, governmentpassword EPAS [facility]  [facility] [place] [facility]  Office  [phone]  CC: Primary care physician; Patient, No Pcp Per  Note: This dictation was prepared with Dragon dictation along with smaller phrase technology. Any transcriptional errors that result from this process are unintentional.

## 2018-03-08 NOTE — Consult Note (Signed)
1Referring Physician: Willadean Carol    Chief Complaint: Vision loss in the right eye  HPI: Greg Poole is an 62 y.o. male with pertinent history of hypertension on antihypertensive, smoking, and peripheral neuropathy presented to the hospital with sudden onset of right vision loss. He states that he was on his computer around 4 - 5 PM when he suddenly had graying vision in his right eye.  He reports that prior to vision loss his eyes felt tired and itchy so he rubbed them about 3 times and when he took his hands off, the right eye felt funny as if he was seen through a pinhole.  He also reports seeing sports in front of his vision. Patient describes episode as painless loss of vision in the right eye without associated vertigo/dizziness. No symptoms of eye redness or pain and tearing associated with visual loss (intermittent angle closure glaucoma). Patient states nothing seem to precipitate episode such as postural changes or exercise, loss of vision when eyes are moved into certain positions of gaze (gaze-evoked amaurosis) or loss of vision after exercise or a hot shower (Uhthoff's symptom) to suggest demyelinating disease of the optic nerve.  Denies associated altered sensorium, speech abnormality, cranial nerve deficit, seizures, focal motor or sensory deficits, diplopia, or vomiting, ipsilateral or contralateral paralysis/weakness, numbness or tingling. He denies history of head injury, trauma or recent eye infection.  Work-up in the ED with CT head did not show any acute intracranial abnormality.  Initial NIH stroke scale 0.  Date last known well: Date: 03/07/2018 Time last known well: Time: 16:00  tPA Given: ZO:XWRUEAV NIH stroke scale of 0 and none disabling symptoms.  History reviewed. No pertinent past medical history.  Past Surgical History:  Procedure Laterality Date  . BACK SURGERY      Family History  Problem Relation Age of Onset  . Stroke Mother    Social History:  reports that he  has been smoking. He has never used smokeless tobacco. He reports that he drinks alcohol. He reports that he does not use drugs.  Allergies: No Known Allergies  Medications:  I have reviewed the patient's current medications. Prior to Admission:  Medications Prior to Admission  Medication Sig Dispense Refill Last Dose  . amLODipine (NORVASC) 5 MG tablet Take 5 mg by mouth daily.   03/07/2018 at 0600  . aspirin 325 MG tablet Take 1 tablet by mouth daily.   03/07/2018 at 0600  . bisoprolol-hydrochlorothiazide (ZIAC) 5-6.25 MG tablet Take 1 tablet by mouth daily.   03/07/2018 at 0600  . pregabalin (LYRICA) 100 MG capsule Take 100 mg by mouth 3 (three) times daily.  4 03/07/2018 at Unknown time  . vitamin C (ASCORBIC ACID) 500 MG tablet Take 500 mg by mouth daily.   03/07/2018 at 0600   Scheduled: . aspirin  325 mg Oral Daily  . atorvastatin  40 mg Oral q1800  . docusate sodium  100 mg Oral BID  . heparin  5,000 Units Subcutaneous Q8H  . pregabalin  100 mg Oral TID  . vitamin C  500 mg Oral Daily    ROS: History obtained from the patient   General ROS: negative for - chills, fatigue, fever, night sweats, weight gain or weight loss Psychological ROS: negative for - behavioral disorder, hallucinations, memory difficulties, mood swings or suicidal ideation Ophthalmic ROS: negative for - blurry vision, double vision, eye pain or loss of vision ENT ROS: negative for - epistaxis, nasal discharge, oral lesions, sore throat, tinnitus  or vertigo Allergy and Immunology ROS: negative for - hives or itchy/watery eyes Hematological and Lymphatic ROS: negative for - bleeding problems, bruising or swollen lymph nodes Endocrine ROS: negative for - galactorrhea, hair pattern changes, polydipsia/polyuria or temperature intolerance Respiratory ROS: negative for - cough, hemoptysis, shortness of breath or wheezing Cardiovascular ROS: negative for - chest pain, dyspnea on exertion, edema or irregular  heartbeat Gastrointestinal ROS: negative for - abdominal pain, diarrhea, hematemesis, nausea/vomiting or stool incontinence Genito-Urinary ROS: negative for - dysuria, hematuria, incontinence or urinary frequency/urgency Musculoskeletal ROS: negative for - joint swelling or muscular weakness Neurological ROS: as noted in HPI Dermatological ROS: negative for rash and skin lesion changes  Physical Examination: Blood pressure (!) 150/79, pulse 62, temperature 98.1 F (36.7 C), temperature source Oral, resp. rate 20, height 6' (1.829 m), weight 97.5 kg, SpO2 98 %.   HEENT-  Normocephalic, no lesions, without obvious abnormality.  Normal external eye and conjunctiva.  Normal TM's bilaterally.  Normal auditory canals and external ears. Normal external nose, mucus membranes and septum.  Normal pharynx. Cardiovascular- S1, S2 normal, pulses palpable throughout   Lungs- chest clear, no wheezing, rales, normal symmetric air entry Abdomen- soft, non-tender; bowel sounds normal; no masses,  no organomegaly Extremities- no edema Lymph-no adenopathy palpable Musculoskeletal-no joint tenderness, deformity or swelling Skin-warm and dry, no hyperpigmentation, vitiligo, or suspicious lesions  Neurological Exam   Mental Status: Alert, oriented, thought content appropriate.  Speech fluent without evidence of aphasia.  Able to follow 3 step commands without difficulty. Attention span and concentration seemed appropriate  Cranial Nerves: II: Discs flat bilaterally; Visual fields grossly normal, pupils equal, round, reactive to light and accommodation III,IV, VI: ptosis not present, extra-ocular motions intact bilaterally V,VII: smile symmetric, facial light touch sensation intact VIII: hearing normal bilaterally IX,X: gag reflex present XI: bilateral shoulder shrug XII: midline tongue extension Motor: Right :  Upper extremity   5/5 Without pronator drift      Left: Upper extremity   5/5 without pronator  drift Right:   Lower extremity   5/5                                          Left: Lower extremity   5/5 Tone and bulk:normal tone throughout; no atrophy noted Sensory: Pinprick and light touch intact bilaterally Deep Tendon Reflexes: 2+ and symmetric throughout Plantars: Right:  downgoing                            Left:  downgoing Cerebellar: Finger-to-nose testing intact bilaterally. Heel to shin testing normal bilaterally Gait: Steady when observed in small enclosed room  Data Reviewed  Laboratory Studies:  Basic Metabolic Panel: Recent Labs  Lab 03/07/18 2007 03/08/18 0512  NA 139 140  K 3.9 3.9  CL 102 106  CO2 28 24  GLUCOSE 113* 102*  BUN 25* 19  CREATININE 0.89 0.56*  CALCIUM 9.3 8.8*    Liver Function Tests: Recent Labs  Lab 03/07/18 2007  AST 23  ALT 36  ALKPHOS 52  BILITOT 0.7  PROT 7.3  ALBUMIN 3.9   No results for input(s): LIPASE, AMYLASE in the last 168 hours. No results for input(s): AMMONIA in the last 168 hours.  CBC: Recent Labs  Lab 03/07/18 2007 03/08/18 0512  WBC 12.2* 9.0  HGB 14.9 13.9  HCT  43.6 41.2  MCV 95.6 96.3  PLT 308 277    Cardiac Enzymes: No results for input(s): CKTOTAL, CKMB, CKMBINDEX, TROPONINI in the last 168 hours.  BNP: Invalid input(s): POCBNP  CBG: Recent Labs  Lab 03/08/18 0806  GLUCAP 108*    Microbiology: No results found for this or any previous visit.  Coagulation Studies: No results for input(s): LABPROT, INR in the last 72 hours.  Urinalysis: No results for input(s): COLORURINE, LABSPEC, PHURINE, GLUCOSEU, HGBUR, BILIRUBINUR, KETONESUR, PROTEINUR, UROBILINOGEN, NITRITE, LEUKOCYTESUR in the last 168 hours.  Invalid input(s): APPERANCEUR  Lipid Panel:    Component Value Date/Time   CHOL 202 (H) 03/08/2018 0512   TRIG 111 03/08/2018 0512   HDL 48 03/08/2018 0512   CHOLHDL 4.2 03/08/2018 0512   VLDL 22 03/08/2018 0512   LDLCALC 132 (H) 03/08/2018 0512    HgbA1C:  Lab Results   Component Value Date   HGBA1C 5.4 03/08/2018    Urine Drug Screen:  No results found for: LABOPIA, COCAINSCRNUR, LABBENZ, AMPHETMU, THCU, LABBARB  Alcohol Level: No results for input(s): ETH in the last 168 hours.  Other results: EKG: normal EKG, normal sinus rhythm, unchanged from previous tracings. Vent. rate 52 BPM PR interval * ms QRS duration 96 ms QT/QTc 423/394 ms P-R-T axes 42 78 64  Imaging: Dg Chest 2 View  Result Date: 03/07/2018 CLINICAL DATA:  Vision loss in right eye since 5 p.m. EXAM: CHEST - 2 VIEW COMPARISON:  None. FINDINGS: The heart size and mediastinal contours are within normal limits. Both lungs are clear. The visualized skeletal structures are unremarkable. IMPRESSION: No active cardiopulmonary disease. Electronically Signed   By: Gerome Sam III M.D   On: 03/07/2018 20:03   Ct Head Wo Contrast  Result Date: 03/07/2018 CLINICAL DATA:  Visual loss in the right eye. EXAM: CT HEAD WITHOUT CONTRAST TECHNIQUE: Contiguous axial images were obtained from the base of the skull through the vertex without intravenous contrast. COMPARISON:  None. FINDINGS: Brain: No evidence of acute infarction, hemorrhage, hydrocephalus, extra-axial collection or mass lesion/mass effect. Minimal small vessel ischemic disease of periventricular white matter, age indeterminate given lack of prior studies for comparison. Vascular: No hyperdense vessel or unexpected calcification. Skull: Normal. Negative for fracture or focal lesion. Sinuses/Orbits: No acute finding. The globes are incompletely imaged on this head CT. That which is included is unremarkable. Other: None. IMPRESSION: Minimal small vessel ischemic disease periventricular white matter. No acute intracranial abnormality. No CT findings to explain the patient's right-sided visual loss. Electronically Signed   By: Tollie Eth M.D.   On: 03/07/2018 20:21   Mr Brain Wo Contrast  Result Date: 03/08/2018 CLINICAL DATA:  RIGHT vision  loss since yesterday. History of hypertension. EXAM: MRI HEAD WITHOUT CONTRAST TECHNIQUE: Multiplanar, multiecho pulse sequences of the brain and surrounding structures were obtained without intravenous contrast. COMPARISON:  CT HEAD March 07, 2018 FINDINGS: INTRACRANIAL CONTENTS: No reduced diffusion to suggest acute ischemia. No susceptibility artifact to suggest hemorrhage. The ventricles and sulci are normal for patient's age. Patchy supratentorial white matter FLAIR T2 hyperintensities. No suspicious parenchymal signal, masses, mass effect. No abnormal extra-axial fluid collections. No extra-axial masses. VASCULAR: Normal major intracranial vascular flow voids present at skull base. SKULL AND UPPER CERVICAL SPINE: No abnormal sellar expansion. No suspicious calvarial bone marrow signal. Craniocervical junction maintained. SINUSES/ORBITS: The mastoid air-cells and included paranasal sinuses are well-aerated.The included ocular globes and orbital contents are non-suspicious. OTHER: None. IMPRESSION: 1. No acute intracranial process. 2. Mild-to-moderate chronic  small vessel ischemic changes. Electronically Signed   By: Awilda Metro M.D.   On: 03/08/2018 04:09   Patient seen and examined.  Clinical course and management discussed.  Necessary edits performed.  I agree with the above.  Assessment and plan of care developed and discussed below.  Assessment: 62 y.o. male  with pertinent history of hypertension on antihypertensive, smoking, and peripheral neuropathy presented to the hospital with sudden onset of painless vision loss in the right eye without associated symptoms.  Right eye vision has not returned to baseline although improved from initial onset.  Etiology unclear although CRAO, among other ophthalmologic conditions, is in the differential.  MRI of the brain reviewed and shows no acute intracranial abnormality.  Carotid dopplers show no evidence of hemodynamically significant stenosis.   Hemoglobin A1c 5.4, LDL 132.  Patient states he was taking aspirin 81 mg prior to this episode.  Stroke Risk Factors - family history, hyperlipidemia, hypertension and smoking  Plan: 1. Prophylactic therapy-Antiplatelet med: switch Aspirin to Plavix - dose 75 mg /day 2. Start statin with goal low density lipoprotein (LDL) <70 mg/dl 3. Echocardiogram pending 4. Ophthalmology evaluation 5. Smoking cessation counseling 6. PT consult, OT consult, Speech consult  7. NPO until RN stroke swallow screen 8. Telemetry monitoring 9. Frequent neuro checks   This patient was staffed with Dr. Verlon Au, Thad Ranger who personally evaluated patient, reviewed documentation and agreed with assessment and plan of care as above.  Webb Silversmith, DNP, FNP-BC Board certified Nurse Practitioner Neurology Department  03/08/2018, 11:44 AM  Thana Farr, MD Neurology [phone]  03/08/2018  1:13 PM

## 2018-03-09 DIAGNOSIS — G459 Transient cerebral ischemic attack, unspecified: Secondary | ICD-10-CM | POA: Diagnosis not present

## 2018-03-09 LAB — GLUCOSE, CAPILLARY: GLUCOSE-CAPILLARY: 112 mg/dL — AB (ref 70–99)

## 2018-03-09 LAB — ECHOCARDIOGRAM COMPLETE
HEIGHTINCHES: 72 in
WEIGHTICAEL: 3439.99 [oz_av]

## 2018-03-09 LAB — HIV ANTIBODY (ROUTINE TESTING W REFLEX): HIV Screen 4th Generation wRfx: NONREACTIVE

## 2018-03-09 MED ORDER — CLOPIDOGREL BISULFATE 75 MG PO TABS
75.0000 mg | ORAL_TABLET | Freq: Every day | ORAL | Status: DC
  Administered 2018-03-09 – 2018-03-10 (×2): 75 mg via ORAL
  Filled 2018-03-09 (×2): qty 1

## 2018-03-09 NOTE — Progress Notes (Signed)
Spoke with Dr. Laban EmperorQ. Chen, Hospitalist. Explained had been unable to contact Dr. Nancy MarusMayo. Requested he contact Dr. Nancy MarusMayo to discuss issue about TEE.

## 2018-03-09 NOTE — Progress Notes (Signed)
Dr. Chen paged.  

## 2018-03-09 NOTE — Progress Notes (Addendum)
Subjective: Patient awake and alert x 4. No new complaints of stroke like symptoms. Vision continues to consist of seeing grey spots especially when looking at objects in front of him.  Objective: Current vital signs: BP 117/74 (BP Location: Right Arm)   Pulse (!) 51   Temp (!) 97.4 F (36.3 C) (Oral)   Resp 17   Ht 6' (1.829 m)   Wt 98.9 kg   SpO2 98%   BMI 29.57 kg/m  Vital signs in last 24 hours: Temp:  [97.4 F (36.3 C)-98.1 F (36.7 C)] 97.4 F (36.3 C) (11/14 1141) Pulse Rate:  [51-58] 51 (11/14 1141) Resp:  [16-17] 17 (11/14 1141) BP: (104-117)/(60-74) 117/74 (11/14 1141) SpO2:  [96 %-98 %] 98 % (11/14 1141) Weight:  [98.9 kg] 98.9 kg (11/14 0356)  Intake/Output from previous day: 11/13 0701 - 11/14 0700 In: 480 [P.O.:480] Out: -  Intake/Output this shift: No intake/output data recorded. Nutritional status:  Diet Order            Diet NPO time specified  Diet effective now        Diet - low sodium heart healthy             Neurologic Exam: Mental Status: Alert, oriented, thought content appropriate. Speech fluent without evidence of aphasia. Able to follow 3 step commands without difficulty. Attention span and concentration seemed appropriate  Cranial Nerves: II: Discs flat bilaterally; Visual fields grossly normal, pupils equal, round, reactive to light and accommodation III,IV, VI: ptosis not present, extra-ocular motions intact bilaterally V,VII: smile symmetric, facial light touch sensationintact VIII: hearing normal bilaterally IX,X: gag reflex present XI: bilateral shoulder shrug XII: midline tongue extension Motor: Right :Upper extremity 5/5Without pronator driftLeft: Upper extremity 5/5 without pronator drift Right:Lower extremity 5/5Left: Lower extremity 5/5 Tone and bulk:normal tone throughout; no atrophy noted Sensory: Pinprick and light touchintact bilaterally Deep Tendon  Reflexes: 2+ and symmetric throughout Plantars: Right: downgoingLeft:  downgoing Cerebellar: Finger-to-nosetesting intact bilaterally.Heel to shin testing normal bilaterally Gait: Steady when observed in small enclosed room  Lab Results: Basic Metabolic Panel: Recent Labs  Lab 03/07/18 2007 03/08/18 0512  NA 139 140  K 3.9 3.9  CL 102 106  CO2 28 24  GLUCOSE 113* 102*  BUN 25* 19  CREATININE 0.89 0.56*  CALCIUM 9.3 8.8*    Liver Function Tests: Recent Labs  Lab 03/07/18 2007  AST 23  ALT 36  ALKPHOS 52  BILITOT 0.7  PROT 7.3  ALBUMIN 3.9   No results for input(s): LIPASE, AMYLASE in the last 168 hours. No results for input(s): AMMONIA in the last 168 hours.  CBC: Recent Labs  Lab 03/07/18 2007 03/08/18 0512  WBC 12.2* 9.0  HGB 14.9 13.9  HCT 43.6 41.2  MCV 95.6 96.3  PLT 308 277    Cardiac Enzymes: No results for input(s): CKTOTAL, CKMB, CKMBINDEX, TROPONINI in the last 168 hours.  Lipid Panel: Recent Labs  Lab 03/08/18 0512  CHOL 202*  TRIG 111  HDL 48  CHOLHDL 4.2  VLDL 22  LDLCALC 191*    CBG: Recent Labs  Lab 03/08/18 0806 03/09/18 0724  GLUCAP 108* 112*    Microbiology: No results found for this or any previous visit.  Coagulation Studies: No results for input(s): LABPROT, INR in the last 72 hours.  Imaging: Dg Chest 2 View  Result Date: 03/07/2018 CLINICAL DATA:  Vision loss in right eye since 5 p.m. EXAM: CHEST - 2 VIEW COMPARISON:  None. FINDINGS: The  heart size and mediastinal contours are within normal limits. Both lungs are clear. The visualized skeletal structures are unremarkable. IMPRESSION: No active cardiopulmonary disease. Electronically Signed   By: Gerome Samavid  Williams III M.D   On: 03/07/2018 20:03   Ct Head Wo Contrast  Result Date: 03/07/2018 CLINICAL DATA:  Visual loss in the right eye. EXAM: CT HEAD WITHOUT CONTRAST TECHNIQUE: Contiguous axial images were obtained from the base of  the skull through the vertex without intravenous contrast. COMPARISON:  None. FINDINGS: Brain: No evidence of acute infarction, hemorrhage, hydrocephalus, extra-axial collection or mass lesion/mass effect. Minimal small vessel ischemic disease of periventricular white matter, age indeterminate given lack of prior studies for comparison. Vascular: No hyperdense vessel or unexpected calcification. Skull: Normal. Negative for fracture or focal lesion. Sinuses/Orbits: No acute finding. The globes are incompletely imaged on this head CT. That which is included is unremarkable. Other: None. IMPRESSION: Minimal small vessel ischemic disease periventricular white matter. No acute intracranial abnormality. No CT findings to explain the patient's right-sided visual loss. Electronically Signed   By: Tollie Ethavid  Kwon M.D.   On: 03/07/2018 20:21   Mr Brain Wo Contrast  Result Date: 03/08/2018 CLINICAL DATA:  RIGHT vision loss since yesterday. History of hypertension. EXAM: MRI HEAD WITHOUT CONTRAST TECHNIQUE: Multiplanar, multiecho pulse sequences of the brain and surrounding structures were obtained without intravenous contrast. COMPARISON:  CT HEAD March 07, 2018 FINDINGS: INTRACRANIAL CONTENTS: No reduced diffusion to suggest acute ischemia. No susceptibility artifact to suggest hemorrhage. The ventricles and sulci are normal for patient's age. Patchy supratentorial white matter FLAIR T2 hyperintensities. No suspicious parenchymal signal, masses, mass effect. No abnormal extra-axial fluid collections. No extra-axial masses. VASCULAR: Normal major intracranial vascular flow voids present at skull base. SKULL AND UPPER CERVICAL SPINE: No abnormal sellar expansion. No suspicious calvarial bone marrow signal. Craniocervical junction maintained. SINUSES/ORBITS: The mastoid air-cells and included paranasal sinuses are well-aerated.The included ocular globes and orbital contents are non-suspicious. OTHER: None. IMPRESSION: 1. No  acute intracranial process. 2. Mild-to-moderate chronic small vessel ischemic changes. Electronically Signed   By: Awilda Metroourtnay  Bloomer M.D.   On: 03/08/2018 04:09   [place] Carotid Bilateral  Result Date: 03/08/2018 CLINICAL DATA:  62 year old male with a history TIA EXAM: BILATERAL CAROTID DUPLEX ULTRASOUND TECHNIQUE: Wallace CullensGray scale imaging, color Doppler and duplex ultrasound were performed of bilateral carotid and vertebral arteries in the neck. COMPARISON:  None. FINDINGS: Criteria: Quantification of carotid stenosis is based on velocity parameters that correlate the residual internal carotid diameter with NASCET-based stenosis levels, using the diameter of the distal internal carotid lumen as the denominator for stenosis measurement. The following velocity measurements were obtained: RIGHT ICA:  Systolic 73 cm/sec, Diastolic 11 cm/sec CCA:  73 cm/sec SYSTOLIC ICA/CCA RATIO:  1.0 ECA:  89 cm/sec LEFT ICA:  Systolic 72 cm/sec, Diastolic 32 cm/sec CCA:  85 cm/sec SYSTOLIC ICA/CCA RATIO:  0.9 ECA:  94 cm/sec Right Brachial SBP: Not acquired Left Brachial SBP: Not acquired RIGHT CAROTID ARTERY: No significant calcifications of the right common carotid artery. Intermediate waveform maintained. Heterogeneous and partially calcified plaque at the right carotid bifurcation. No significant lumen shadowing. Low resistance waveform of the right ICA. No significant tortuosity. RIGHT VERTEBRAL ARTERY: Antegrade flow with low resistance waveform. LEFT CAROTID ARTERY: No significant calcifications of the left common carotid artery. Intermediate waveform maintained. Heterogeneous and partially calcified plaque at the left carotid bifurcation without significant lumen shadowing. Low resistance waveform of the left ICA. No significant tortuosity. LEFT VERTEBRAL ARTERY:  Antegrade flow  with low resistance waveform. IMPRESSION: Color duplex indicates minimal heterogeneous and calcified plaque, with no hemodynamically significant stenosis  by duplex criteria in the extracranial cerebrovascular circulation. Signed, Yvone Neu. Reyne Dumas, RPVI [facility] Radiology Electronically Signed   By: Gilmer Mor D.O.   On: 03/08/2018 12:27   Medications:  I have reviewed the patient's current medications. Prior to Admission:  Medications Prior to Admission  Medication Sig Dispense Refill Last Dose  . amLODipine (NORVASC) 5 MG tablet Take 5 mg by mouth daily.   03/07/2018 at 0600  . aspirin 325 MG tablet Take 1 tablet by mouth daily.   03/07/2018 at 0600  . bisoprolol-hydrochlorothiazide (ZIAC) 5-6.25 MG tablet Take 1 tablet by mouth daily.   03/07/2018 at 0600  . pregabalin (LYRICA) 100 MG capsule Take 100 mg by mouth 3 (three) times daily.  4 03/07/2018 at Unknown time  . vitamin C (ASCORBIC ACID) 500 MG tablet Take 500 mg by mouth daily.   03/07/2018 at 0600   Scheduled: . aspirin  325 mg Oral Daily  . atorvastatin  40 mg Oral q1800  . docusate sodium  100 mg Oral BID  . heparin  5,000 Units Subcutaneous Q8H  . pregabalin  100 mg Oral TID  . vitamin C  500 mg Oral Daily   Patient seen and examined.  Clinical course and management discussed.  Necessary edits performed.  I agree with the above.  Assessment and plan of care developed and discussed below.    Assessment: 62 y.o. male  with pertinent history of hypertension on antihypertensive, smoking, and peripheral neuropathy presented to the hospital with sudden onset of painless vision loss in the right eye without associated symptoms.  Right eye vision improving although has not returned to baseline, continues to complain seeing grey spots.  Etiology unclear although CRAO, among other ophthalmologic conditions, is in the differential.  Echocardiogram shows EF of 55-60% with the rest of study inconclusive due to poor sound wave transmission.   Plan: 1. Prophylactic therapy- Plavix - dose 75 mg /day 2. Statin with goal low density  lipoprotein (LDL) <70 mg/dl 3. TEE pending 4. Ophthalmology evaluation 5. Smoking cessation counseling  This patient was staffed with Dr. Verlon Au, Thad Ranger who personally evaluated patient, reviewed documentation and agreed with assessment and plan of care as above.  Webb Silversmith, DNP, FNP-BC Board certified Nurse Practitioner Neurology Department   LOS: 1 day   03/09/2018  12:04 PM  Thana Farr, MD Neurology [phone]  03/09/2018  1:41 PM

## 2018-03-09 NOTE — Progress Notes (Signed)
Contacted by [employer] IncCharge RN Phyllis.Explained patient had been told by Dr. Nancy MarusMayo would have a TEE.Greg Poole had called Special Procedures, did not get an answer, pt was concerned,hungry, had been told he could go home if TEE was okay.Was displeased test had not been done. Was told pt's RN, Torrie MayersMegan Snider had paged Dr. Nancy MarusMayo [phone]. Did not get a call back. Greg DeutscherPhyllis King, RN Charge had also paged Dr. Nancy MarusMayo with no response.

## 2018-03-09 NOTE — Progress Notes (Signed)
Approximate time. Was speaking with patient, wife was on speaker phone, there was also a visitor in the room,patient stated ok to speak about care with visitor in room, wife on phone. Explaining situation. Dr. Nancy MarusMayo called Greg MayersMegan Snider RN phone, spoke with pt, was aware spouse was on telephone. Able to hear patient's end of conversation. He asked Dr. Nancy MarusMayo questions, spouse asked questions. Patient stated he wanted to have his TEE scheduled first in the morning.Pt then stated he was told if that was unable to happen, Dr. Nancy MarusMayo said he could have it as an outpatient.Pt asked about an optometrist referral, then stated was told could be done as outpatient.Patient's wife had asked if it could not be done tonight, could he transfer to another hospital to have it done.Explained it would be difficult to transfer since it is a procedure that could be performed here.When patient ended his conversation with Dr. Nancy MarusMayo, I asked if he had any questions or concerns. He stated he did not, his wife stated she did not either.He asked about eating since he had been NPO all day.Explained the order had been changed to being able to have food until midnight.Asked what he would like from the [employer], pt stated he would have food brought in by a relative. Asked patient to contact Administrative Coordinator if he had other questions or issues.Pleasant interaction with patient and spouse.

## 2018-03-09 NOTE — Progress Notes (Addendum)
[facility] - [place] at [facility]   PATIENT NAME: Greg AndersonRobert Poole    MR#:  [id number]  DATE OF BIRTH:  [birth year]  SUBJECTIVE:   Vision is about the same as yesterday. Still having grey "cloudy" spots throughout his visual field. No headaches, no weakness, no numbness.  REVIEW OF SYSTEMS:  Review of Systems  Constitutional: Negative for chills and fever.  HENT: Negative for congestion and sore throat.   Eyes: Negative for blurred vision, double vision, photophobia and pain.  Respiratory: Negative for cough and shortness of breath.   Cardiovascular: Negative for chest pain, palpitations and leg swelling.  Gastrointestinal: Negative for abdominal pain, nausea and vomiting.  Genitourinary: Negative for dysuria, frequency and urgency.  Musculoskeletal: Negative for back pain and neck pain.  Neurological: Negative for dizziness and headaches.  Psychiatric/Behavioral: Negative for depression. The patient is not nervous/anxious.     DRUG ALLERGIES:  No Known Allergies VITALS:  Blood pressure 115/63, pulse (!) 50, temperature 98.1 F (36.7 C), resp. rate 20, height 6' (1.829 m), weight 98.9 kg, SpO2 97 %. PHYSICAL EXAMINATION:  Physical Exam  GENERAL:  62 y.o.-year-old patient lying in the bed with no acute distress.  EYES: Pupils equal, round, reactive to light and accommodation. No scleral icterus. Extraocular muscles intact.  HEENT: Head atraumatic, normocephalic. Oropharynx and nasopharynx clear.  NECK:  Supple, no jugular venous distention. No thyroid enlargement, no tenderness.  LUNGS: Normal breath sounds bilaterally, no wheezing, rales,rhonchi or crepitation. No use of accessory muscles of respiration.  CARDIOVASCULAR: RRR, S1, S2 normal. No S3/S4.  ABDOMEN: Soft, nontender, nondistended. Bowel sounds present. No organomegaly or mass.  EXTREMITIES: No pedal edema, cyanosis, or clubbing.  NEUROLOGIC: Cranial nerves II through XII are intact. Muscle strength  5/5 in all extremities. Sensation intact.   PSYCHIATRIC: The patient is alert and oriented x 3.  SKIN: No obvious rash, lesion, or ulcer. LABORATORY PANEL:  Male CBC Recent Labs  Lab 03/08/18 0512  WBC 9.0  HGB 13.9  HCT 41.2  PLT 277   ------------------------------------------------------------------------------------------------------------------ Chemistries  Recent Labs  Lab 03/07/18 2007 03/08/18 0512  NA 139 140  K 3.9 3.9  CL 102 106  CO2 28 24  GLUCOSE 113* 102*  BUN 25* 19  CREATININE 0.89 0.56*  CALCIUM 9.3 8.8*  AST 23  --   ALT 36  --   ALKPHOS 52  --   BILITOT 0.7  --    RADIOLOGY:  No results found. ASSESSMENT AND PLAN:   Painless right eye vision loss- due to TIA vs ophthalmologic issue. -Initially unable to see anything out of right eye, except for pinpoint area in the center. Vision improved on the day of discharge and patient now just having small grey spots throughout visual field -MRI brain negative for acute stroke -Carotid dopplers unremarkable -ECHO unremarkable -TEE pending -A1c 5.4%, LDL elevated to 132 -Started on Lipitor 40mg  daily -Seen by neurology, who recommended switching from aspirin to plavix -Cardiac monitoring -Needs ophthalmology appointment as outpatient -PT/OT/SLP consult not medically necessary as patient has no physical deficits or swallowing concerns.  Hypertension- BPs well-controlled -Continue home BP meds  Tobacco use- smoking cessation counseling provided  Peripheral neuropathy- continued lyrica  All the records are reviewed and case discussed with Care Management/Social Worker. Management plans discussed with the patient, family and they are in agreement.  CODE STATUS: Full Code  TOTAL TIME TAKING CARE OF THIS PATIENT: 35 minutes.   More than 50% of the time was  spent in counseling/coordination of care: YES  May be able to discharge later today if TEE is negative.   Jinny Blossom Mayo M.D on 03/09/2018 at  2:30 PM  Between 7am to 6pm - Pager [phone]  After 6pm go to [email or web address] - Social research officer, government  [facility] [place] [facility]  Office  [phone]  CC: Primary care physician; Patient, No Pcp Per  Note: This dictation was prepared with Dragon dictation along with smaller phrase technology. Any transcriptional errors that result from this process are unintentional.

## 2018-03-10 ENCOUNTER — Encounter
Admission: EM | Disposition: A | Discharge status: Home / Self Care | Payer: Self-pay | Source: Home / Self Care | Attending: Emergency Medicine

## 2018-03-10 ENCOUNTER — Observation Stay (HOSPITAL_BASED_OUTPATIENT_CLINIC_OR_DEPARTMENT_OTHER)
Admit: 2018-03-10 | Discharge: 2018-03-10 | Disposition: A | Discharge status: Home / Self Care | Payer: 59 | Attending: Nurse Practitioner | Admitting: Nurse Practitioner

## 2018-03-10 ENCOUNTER — Observation Stay: Admit: 2018-03-10 | Payer: 59

## 2018-03-10 DIAGNOSIS — G459 Transient cerebral ischemic attack, unspecified: Secondary | ICD-10-CM

## 2018-03-10 DIAGNOSIS — H539 Unspecified visual disturbance: Secondary | ICD-10-CM

## 2018-03-10 HISTORY — PX: TEE WITHOUT CARDIOVERSION: SHX5443

## 2018-03-10 SURGERY — ECHOCARDIOGRAM, TRANSESOPHAGEAL
Anesthesia: Choice

## 2018-03-10 MED ORDER — FENTANYL CITRATE (PF) 100 MCG/2ML IJ SOLN
INTRAMUSCULAR | Status: AC | PRN
  Administered 2018-03-10: 50 ug via INTRAVENOUS

## 2018-03-10 MED ORDER — LIDOCAINE VISCOUS HCL 2 % MT SOLN
OROMUCOSAL | Status: AC
  Administered 2018-03-10: 15 mL
  Filled 2018-03-10: qty 15

## 2018-03-10 MED ORDER — SODIUM CHLORIDE FLUSH 0.9 % IV SOLN
INTRAVENOUS | Status: AC
  Filled 2018-03-10: qty 10

## 2018-03-10 MED ORDER — SODIUM CHLORIDE 0.9 % IV SOLN
INTRAVENOUS | Status: DC
  Administered 2018-03-10: 1000 mL via INTRAVENOUS

## 2018-03-10 MED ORDER — FENTANYL CITRATE (PF) 100 MCG/2ML IJ SOLN
INTRAMUSCULAR | Status: AC
  Filled 2018-03-10: qty 2

## 2018-03-10 MED ORDER — MIDAZOLAM HCL 5 MG/5ML IJ SOLN
INTRAMUSCULAR | Status: AC
  Filled 2018-03-10: qty 5

## 2018-03-10 MED ORDER — MIDAZOLAM HCL 5 MG/5ML IJ SOLN
INTRAMUSCULAR | Status: AC | PRN
  Administered 2018-03-10: 2 mg via INTRAVENOUS
  Administered 2018-03-10: 1 mg via INTRAVENOUS

## 2018-03-10 MED ORDER — BUTAMBEN-TETRACAINE-BENZOCAINE 2-2-14 % EX AERO
INHALATION_SPRAY | CUTANEOUS | Status: AC
  Administered 2018-03-10: 3
  Filled 2018-03-10: qty 5

## 2018-03-10 NOTE — Progress Notes (Signed)
Patient discharged home with spouse. Patient verbalized understanding of education. Patient with no complaints. 

## 2018-03-10 NOTE — Progress Notes (Signed)
Subjective: Patient state that his vision is stable. He is sitting up in the bed working in his laptop. Denies any complaints or issues today.  Objective: Current vital signs: BP 123/71   Pulse 63   Temp 97.9 F (36.6 C) (Oral)   Resp 18   Ht 6' (1.829 m)   Wt 45.1 kg   SpO2 96%   BMI 13.48 kg/m  Vital signs in last 24 hours: Temp:  [97.4 F (36.3 C)-98.1 F (36.7 C)] 97.9 F (36.6 C) (11/15 1130) Pulse Rate:  [50-97] 63 (11/15 1130) Resp:  [17-20] 18 (11/15 1130) BP: (115-145)/(63-97) 123/71 (11/15 1130) SpO2:  [96 %-100 %] 96 % (11/15 1130) Weight:  [45.1 kg] 45.1 kg (11/15 0446)  Intake/Output from previous day: No intake/output data recorded. Intake/Output this shift: No intake/output data recorded. Nutritional status:  Diet Order            Diet NPO time specified  Diet effective now        Diet - low sodium heart healthy             Neurologic Exam:  Mental Status: Alert, oriented, thought content appropriate. Speech fluent without evidence of aphasia. Able to follow 3 step commands without difficulty. Attention span and concentration seemed appropriate  Cranial Nerves: II: Discs flat bilaterally; Visual fields grossly normal, pupils equal, round, reactive to light and accommodation III,IV, VI: ptosis not present, extra-ocular motions intact bilaterally V,VII: smile symmetric, facial light touch sensationintact VIII: hearing normal bilaterally IX,X: gag reflex present XI: bilateral shoulder shrug XII: midline tongue extension Motor: 5/5 throughout Sensory: Pinprick and light touchintact bilaterally   Lab Results: Basic Metabolic Panel: Recent Labs  Lab 03/07/18 2007 03/08/18 0512  NA 139 140  K 3.9 3.9  CL 102 106  CO2 28 24  GLUCOSE 113* 102*  BUN 25* 19  CREATININE 0.89 0.56*  CALCIUM 9.3 8.8*    Liver Function Tests: Recent Labs  Lab 03/07/18 2007  AST 23  ALT 36  ALKPHOS 52  BILITOT 0.7  PROT 7.3  ALBUMIN 3.9   No  results for input(s): LIPASE, AMYLASE in the last 168 hours. No results for input(s): AMMONIA in the last 168 hours.  CBC: Recent Labs  Lab 03/07/18 2007 03/08/18 0512  WBC 12.2* 9.0  HGB 14.9 13.9  HCT 43.6 41.2  MCV 95.6 96.3  PLT 308 277    Cardiac Enzymes: No results for input(s): CKTOTAL, CKMB, CKMBINDEX, TROPONINI in the last 168 hours.  Lipid Panel: Recent Labs  Lab 03/08/18 0512  CHOL 202*  TRIG 111  HDL 48  CHOLHDL 4.2  VLDL 22  LDLCALC 573*    CBG: Recent Labs  Lab 03/08/18 0806 03/09/18 0724  GLUCAP 108* 112*    Microbiology: No results found for this or any previous visit.  Coagulation Studies: No results for input(s): LABPROT, INR in the last 72 hours.  Imaging: No results found.  Medications:  I have reviewed the patient's current medications. Prior to Admission:  Medications Prior to Admission  Medication Sig Dispense Refill Last Dose  . amLODipine (NORVASC) 5 MG tablet Take 5 mg by mouth daily.   03/07/2018 at 0600  . aspirin 325 MG tablet Take 1 tablet by mouth daily.   03/07/2018 at 0600  . bisoprolol-hydrochlorothiazide (ZIAC) 5-6.25 MG tablet Take 1 tablet by mouth daily.   03/07/2018 at 0600  . pregabalin (LYRICA) 100 MG capsule Take 100 mg by mouth 3 (three) times daily.  4  03/07/2018 at Unknown time  . vitamin C (ASCORBIC ACID) 500 MG tablet Take 500 mg by mouth daily.   03/07/2018 at 0600   Scheduled: . [MAR Hold] atorvastatin  40 mg Oral q1800  . butamben-tetracaine-benzocaine      . [MAR Hold] clopidogrel  75 mg Oral Daily  . [MAR Hold] docusate sodium  100 mg Oral BID  . fentaNYL      . [MAR Hold] heparin  5,000 Units Subcutaneous Q8H  . lidocaine      . midazolam      . [MAR Hold] pregabalin  100 mg Oral TID  . sodium chloride flush      . [MAR Hold] vitamin C  500 mg Oral Daily   Patient seen and examined.  Clinical course and management discussed.  Necessary edits performed.  I agree with the above.  Assessment and  plan of care developed and discussed below.    Assessment:62 y.o.malewith pertinent history of hypertension on antihypertensive, smoking, and peripheral neuropathy presented to the hospital with sudden onset of painless vision loss in the right eye without associated symptoms. Right eye vision still not at baseline, continues to complain seeing grey spots. Etiology unclear although CRAO, among other ophthalmologic conditions, is in the differential.   Plan: 1. Prophylactic therapy- Plavix - dose 75 mg /day 2. Statin with goal low density lipoprotein (LDL) <70 mg/dl 3. TEE pending 4. Ophthalmology evaluation 5. Smoking cessation counseling  This patient was staffed with Dr. Verlon AuLeslie, Thad Rangereynolds who personally evaluated patient, reviewed documentation and agreed with assessment and plan of care as above.  Webb SilversmithElizabeth Ouma, DNP, FNP-BC Board certified Nurse Practitioner Neurology Department    LOS: 1 day   03/10/2018  11:34 AM  Thana FarrLeslie Janelly Switalski, MD Neurology [phone]  03/10/2018  12:25 PM

## 2018-03-10 NOTE — Plan of Care (Signed)
  Problem: Skin Integrity: Goal: Risk for impaired skin integrity will decrease Outcome: Progressing   

## 2018-03-10 NOTE — Progress Notes (Signed)
*  PRELIMINARY RESULTS* Echocardiogram Echocardiogram Transesophageal has been performed.  Cristela BlueHege, Kennedy Brines 03/10/2018, 12:42 PM

## 2018-03-10 NOTE — Progress Notes (Signed)
    [facility] has been requested to perform a transesophageal echocardiogram on Greg Poole for TIA.  After careful review of history and examination, the risks and benefits of transesophageal echocardiogram have been explained including risks of esophageal damage, perforation (1:10,000 risk), bleeding, pharyngeal hematoma as well as other potential complications associated with conscious sedation including aspiration, arrhythmia, respiratory failure and death. Alternatives to treatment were discussed, questions were answered. Patient is willing to proceed.   Nicolasa Duckinghristopher Cristofher Livecchi, NP  03/10/2018 12:01 PM

## 2018-03-10 NOTE — CV Procedure (Signed)
TEE was performed without complications.  It showed normal LV systolic function with no evidence of ASD or PFO.  Overall unremarkable and no evidence of cardiac source of embolism.  Full report to follow.  During this procedure the patient is administered a total of Versed 3 mg and Fentanyl 50 mcg to achieve and maintain moderate conscious sedation.  The patient's heart rate, blood pressure, and oxygen saturation are monitored continuously during the procedure. The period of conscious sedation is 15 minutes, of which I was present face-to-face 100% of this time.

## 2018-03-10 NOTE — Discharge Summary (Signed)
[facility] - [place] at [facility]   PATIENT NAME: Greg Poole    MR#:  [id number]  DATE OF BIRTH:  [birth year]  DATE OF ADMISSION:  03/07/2018   ADMITTING PHYSICIAN: Cammy Copa, MD  DATE OF DISCHARGE: 03/10/18  PRIMARY CARE PHYSICIAN: Patient, No Pcp Per   ADMISSION DIAGNOSIS:  TIA (transient ischemic attack) [G45.9] DISCHARGE DIAGNOSIS:  Active Problems:   TIA (transient ischemic attack)  SECONDARY DIAGNOSIS:  History reviewed. No pertinent past medical history. HOSPITAL COURSE:   Jorey is a 62 year old male with a PMH of tobacco use who presented to the ED with painless right eye vision loss. He described the vision loss as "not being able to see except for a small pinpoint spot in the middle of his eye". He was admitted for stroke rule out.  Painless right eye vision loss- due to TIA vs central retinal artery occlusion vs other ophthalmologic issue. -Initially unable to see anything out of his right eye, except for pinpoint area in the center. Vision improved on the day of discharge and patient now just having small grey spots throughout visual field. -MRI brain negative for stroke -Carotid dopplers unremarkable -ECHO with normal EF and no abnormalities -Seen by neurology, who recommended switching patient from aspirin 81mg  daily to plavix, also recommended TEE -TEE done 03/10/18 and was negative for cardiac source of emboli -Lipid panel with LDL 132, started on Lipitor 40mg  daily -Smoking cessation counseling provided -Discussed with ophthalmology over the phone, who recommended that patient follow-up with ophtho as an outpatient -Outpatient appointment scheduled for 03/10/18 at 3:10PM at [facility], as patient preferred to be seen in [facility]  DISCHARGE CONDITIONS:  Loss of vision in right eye Hypertension Hyperlipidemia Tobacco use Peripheral neuropathy CONSULTS OBTAINED:  Treatment Team:  Kym Groom, MD Iran Ouch, MD DRUG ALLERGIES:  No Known Allergies DISCHARGE MEDICATIONS:   Allergies as of 03/10/2018   No Known Allergies     Medication List    STOP taking these medications   aspirin 325 MG tablet     TAKE these medications   amLODipine 5 MG tablet Commonly known as:  NORVASC Take 5 mg by mouth daily.   atorvastatin 40 MG tablet Commonly known as:  LIPITOR Take 1 tablet (40 mg total) by mouth daily at 6 PM.   bisoprolol-hydrochlorothiazide 5-6.25 MG tablet Commonly known as:  ZIAC Take 1 tablet by mouth daily.   clopidogrel 75 MG tablet Commonly known as:  PLAVIX Take 1 tablet (75 mg total) by mouth daily.   pregabalin 100 MG capsule Commonly known as:  LYRICA Take 100 mg by mouth 3 (three) times daily.   vitamin C 500 MG tablet Commonly known as:  ASCORBIC ACID Take 500 mg by mouth daily.        DISCHARGE INSTRUCTIONS:  1.  Follow-up with PCP in 1 to 2 weeks 2.  Follow-up with ophthalmology today 3.  Switched aspirin to Plavix 4.  Started patient on Lipitor 5.  Needs continued tobacco cessation counseling as an outpatient.  DIET:  Cardiac diet DISCHARGE CONDITION:  Stable ACTIVITY:  Activity as tolerated OXYGEN:  Home Oxygen: No.  Oxygen Delivery: room air DISCHARGE LOCATION:  home   If you experience worsening of your admission symptoms, develop shortness of breath, life threatening emergency, suicidal or homicidal thoughts you must seek medical attention immediately by calling 911 or calling your MD immediately  if symptoms less severe.  You Must read complete instructions/literature  along with all the possible adverse reactions/side effects for all the Medicines you take and that have been prescribed to you. Take any new Medicines after you have completely understood and accpet all the possible adverse reactions/side effects.   Please note  You were cared for by a hospitalist during your hospital stay. If you have any questions about your  discharge medications or the care you received while you were in the hospital after you are discharged, you can call the unit and asked to speak with the hospitalist on call if the hospitalist that took care of you is not available. Once you are discharged, your primary care physician will handle any further medical issues. Please note that NO REFILLS for any discharge medications will be authorized once you are discharged, as it is imperative that you return to your primary care physician (or establish a relationship with a primary care physician if you do not have one) for your aftercare needs so that they can reassess your need for medications and monitor your lab values.    On the day of Discharge:  VITAL SIGNS:  Blood pressure 130/75, pulse 61, temperature 97.9 F (36.6 C), temperature source Oral, resp. rate 15, height 6' (1.829 m), weight 45.1 kg, SpO2 92 %. PHYSICAL EXAMINATION:  GENERAL:  62 y.o.-year-old patient lying in the bed with no acute distress.  EYES: Pupils equal, round, reactive to light and accommodation. No scleral icterus. Extraocular muscles intact.  HEENT: Head atraumatic, normocephalic. Oropharynx and nasopharynx clear.  NECK:  Supple, no jugular venous distention. No thyroid enlargement, no tenderness.  LUNGS: Normal breath sounds bilaterally, no wheezing, rales,rhonchi or crepitation. No use of accessory muscles of respiration.  CARDIOVASCULAR: RRR, S1, S2 normal. No murmurs, rubs, or gallops.  ABDOMEN: Soft, non-tender, non-distended. Bowel sounds present. No organomegaly or mass.  EXTREMITIES: No pedal edema, cyanosis, or clubbing.  NEUROLOGIC: Cranial nerves II through XII are intact. Muscle strength 5/5 in all extremities. Sensation intact. Gait not checked.  PSYCHIATRIC: The patient is alert and oriented x 3.  SKIN: No obvious rash, lesion, or ulcer.  DATA REVIEW:   CBC Recent Labs  Lab 03/08/18 0512  WBC 9.0  HGB 13.9  HCT 41.2  PLT 277    Chemistries   Recent Labs  Lab 03/07/18 2007 03/08/18 0512  NA 139 140  K 3.9 3.9  CL 102 106  CO2 28 24  GLUCOSE 113* 102*  BUN 25* 19  CREATININE 0.89 0.56*  CALCIUM 9.3 8.8*  AST 23  --   ALT 36  --   ALKPHOS 52  --   BILITOT 0.7  --      Microbiology Results  No results found for this or any previous visit.  RADIOLOGY:  No results found.   Management plans discussed with the patient, family and they are in agreement.  CODE STATUS: Full Code   TOTAL TIME TAKING CARE OF THIS PATIENT: 45 minutes.    Jinny BlossomKaty D Mayo M.D on 03/10/2018 at 1:14 PM  Between 7am to 6pm - Pager - [phone]  After 6pm go to [email or web address] - Social research officer, governmentpassword EPAS [facility]  [facility]  [facility]  Office  [phone]  CC: Primary care physician; Patient, No Pcp Per   Note: This dictation was prepared with Dragon dictation along with smaller phrase technology. Any transcriptional errors that result from this process are unintentional.

## 2018-03-10 NOTE — Discharge Instructions (Signed)
It was so nice to meet you during this hospitalization!  You came into the hospital with changes in vision of your right eye. This may be due to a TIA (mini stroke) or an underlying eye problem. Your MRI was negative for stroke.  We have made the following medication changes: 1. Please STOP aspirin and START Plavix 75mg  daily.  2. Please START lipitor 40mg  daily. This is a cholesterol medication  I have sent these new prescriptions into your pharmacy.  [facility] at 3:10PM for your hospital follow-up appointment.  -Dr. Nancy MarusMayo

## 2018-03-11 LAB — GLUCOSE, CAPILLARY: GLUCOSE-CAPILLARY: 111 mg/dL — AB (ref 70–99)

## 2018-03-13 ENCOUNTER — Encounter: Payer: Self-pay | Admitting: Cardiovascular Disease

## 2018-07-13 ENCOUNTER — Other Ambulatory Visit: Payer: Self-pay

## 2018-07-13 ENCOUNTER — Ambulatory Visit
Admission: EM | Admit: 2018-07-13 | Discharge: 2018-07-13 | Disposition: A | Discharge status: Home / Self Care | Payer: 59 | Attending: Physician Assistant | Admitting: Physician Assistant

## 2018-07-13 DIAGNOSIS — B349 Viral infection, unspecified: Secondary | ICD-10-CM | POA: Diagnosis not present

## 2018-07-13 HISTORY — DX: Essential (primary) hypertension: I10

## 2018-07-13 MED ORDER — IPRATROPIUM BROMIDE 0.06 % NA SOLN: 2.0000 | Freq: Four times a day (QID) | NASAL | 12 refills | Status: DC

## 2018-07-13 MED ORDER — BENZONATATE 100 MG PO CAPS: 100.0000 mg | ORAL_CAPSULE | Freq: Three times a day (TID) | ORAL | 0 refills | Status: DC

## 2018-07-13 MED ORDER — FLUTICASONE PROPIONATE 50 MCG/ACT NA SUSP: 2.0000 | Freq: Every day | NASAL | 0 refills | Status: DC

## 2018-07-13 NOTE — Discharge Instructions (Signed)
Tessalon for cough. Start flonase, atrovent nasal spray for nasal congestion/drainage. You can use over the counter nasal saline rinse such as neti pot for nasal congestion. Keep hydrated, your urine should be clear to pale yellow in color. Tylenol/motrin for fever and pain. Monitor for any worsening of symptoms, chest pain, shortness of breath, wheezing, swelling of the throat, follow up for reevaluation.  ° °For sore throat/cough try using a honey-based tea. Use 3 teaspoons of honey with juice squeezed from half lemon. Place shaved pieces of ginger into 1/2-1 cup of water and warm over stove top. Then mix the ingredients and repeat every 4 hours as needed. °

## 2018-07-13 NOTE — ED Triage Notes (Signed)
Pt c/o cough and sinus headache since Sunday, cough worse at night

## 2018-07-13 NOTE — ED Provider Notes (Signed)
[facility]    CSN: [id number] Arrival date & time: 07/13/18  1403     History   Chief Complaint Chief Complaint  Patient presents with   Cough    HPI Greg Poole is a 63 y.o. male.   63 year old male comes in for 4-5 day history of URI symptoms. Has had rhinorrhea, nasal congestion, throat irritation, sinus pressure, cough.  States has felt wheezing when he lays down at night, can hear in the chest.  Denies shortness of breath, chest pain.  Denies fever, chills, night sweats.  OTC cold medication without relief.  Current some day smoker.     Past Medical History:  Diagnosis Date   Hypertension     Patient Active Problem List   Diagnosis Date Noted   TIA (transient ischemic attack) 03/07/2018    Past Surgical History:  Procedure Laterality Date   BACK SURGERY     TEE WITHOUT CARDIOVERSION N/A 03/10/2018   Procedure: TRANSESOPHAGEAL ECHOCARDIOGRAM (TEE);  Surgeon: Iran Ouch, MD;  Location: [facility] ORS;  Service: Cardiovascular;  Laterality: N/A;       Home Medications    Prior to Admission medications   Medication Sig Start Date End Date Taking? Authorizing Provider  amLODipine (NORVASC) 5 MG tablet Take 5 mg by mouth daily.    [provider]  atorvastatin (LIPITOR) 40 MG tablet Take 1 tablet (40 mg total) by mouth daily at 6 PM. 03/08/18   Mayo, Allyn Kenner, MD  benzonatate (TESSALON) 100 MG capsule Take 1 capsule (100 mg total) by mouth every 8 (eight) hours. 07/13/18   Belinda Fisher, PA-C  bisoprolol-hydrochlorothiazide (ZIAC) 5-6.25 MG tablet Take 1 tablet by mouth daily.    [provider]  fluticasone (FLONASE) 50 MCG/ACT nasal spray Place 2 sprays into both nostrils daily. 07/13/18   Cathie Hoops, Azarias Chiou V, PA-C  ipratropium (ATROVENT) 0.06 % nasal spray Place 2 sprays into both nostrils 4 (four) times daily. 07/13/18   Cathie Hoops, Royetta Probus V, PA-C  pregabalin (LYRICA) 100 MG capsule Take 100 mg by mouth 3 (three) times daily. 02/09/18   [provider]  vitamin C (ASCORBIC ACID) 500 MG tablet Take 500 mg by mouth daily.    [provider]    Family History Family History  Problem Relation Age of Onset   Stroke Mother     Social History Social History   Tobacco Use   Smoking status: Current Every Day Smoker   Smokeless tobacco: Never Used  Substance Use Topics   Alcohol use: Yes   Drug use: Never     Allergies   Patient has no known allergies.   Review of Systems Review of Systems  Reason unable to perform ROS: See HPI as above.     Physical Exam Triage Vital Signs ED Triage Vitals [07/13/18 1414]  Enc Vitals Group     BP 118/78     Pulse Rate 60     Resp 18     Temp 98.1 F (36.7 C)     Temp Source Oral     SpO2 95 %     Weight      Height      Head Circumference      Peak Flow      Pain Score 0     Pain Loc      Pain Edu?      Excl. in GC?    No data found.  Updated Vital Signs BP 118/78 (BP  Location: Left Arm)    Pulse 60    Temp 98.1 F (36.7 C) (Oral)    Resp 18    SpO2 95%   Physical Exam Constitutional:      General: He is not in acute distress.    Appearance: He is well-developed. He is not ill-appearing, toxic-appearing or diaphoretic.  HENT:     Head: Normocephalic and atraumatic.     Right Ear: Tympanic membrane, ear canal and external ear normal. Tympanic membrane is not erythematous or bulging.     Left Ear: Tympanic membrane, ear canal and external ear normal. Tympanic membrane is not erythematous or bulging.     Nose: Congestion present.     Right Sinus: Maxillary sinus tenderness present. No frontal sinus tenderness.     Left Sinus: Maxillary sinus tenderness present. No frontal sinus tenderness.     Mouth/Throat:     Mouth: Mucous membranes are moist.     Pharynx: Oropharynx is clear. Uvula midline.  Eyes:     Conjunctiva/sclera: Conjunctivae normal.     Pupils: Pupils are equal, round, and reactive to light.  Neck:     Musculoskeletal: Normal  range of motion and neck supple.  Cardiovascular:     Rate and Rhythm: Normal rate and regular rhythm.     Heart sounds: Normal heart sounds. No murmur. No friction rub. No gallop.   Pulmonary:     Effort: Pulmonary effort is normal. No accessory muscle usage, prolonged expiration, respiratory distress or retractions.     Breath sounds: Normal breath sounds. No stridor, decreased air movement or transmitted upper airway sounds. No decreased breath sounds, wheezing, rhonchi or rales.  Skin:    General: Skin is warm and dry.  Neurological:     Mental Status: He is alert and oriented to person, place, and time.      UC Treatments / Results  Labs (all labs ordered are listed, but only abnormal results are displayed) Labs Reviewed - No data to display  EKG None  Radiology No results found.  Procedures Procedures (including critical care time)  Medications Ordered in UC Medications - No data to display  Initial Impression / Assessment and Plan / UC Course  I have reviewed the triage vital signs and the nursing notes.  Pertinent labs & imaging results that were available during my care of the patient were reviewed by me and considered in my medical decision making (see chart for details).    Discussed with patient history and exam most consistent with viral URI. Symptomatic treatment as needed. Push fluids. Return precautions given.   Final Clinical Impressions(s) / UC Diagnoses   Final diagnoses:  Viral illness   ED Prescriptions    Medication Sig Dispense Auth. Provider   ipratropium (ATROVENT) 0.06 % nasal spray Place 2 sprays into both nostrils 4 (four) times daily. 15 mL Shanquita Ronning V, PA-C   fluticasone (FLONASE) 50 MCG/ACT nasal spray Place 2 sprays into both nostrils daily. 1 g Rogue Rafalski V, PA-C   benzonatate (TESSALON) 100 MG capsule Take 1 capsule (100 mg total) by mouth every 8 (eight) hours. 21 capsule Threasa Alpha, [place] 07/13/18 1623

## 2018-12-19 IMAGING — US US CAROTID DUPLEX BILAT
1 series · 13 of 24 positions shown · non-contrast
Comparison: None.

CLINICAL DATA: 62-year-old male with a history TIA

EXAM:
BILATERAL CAROTID DUPLEX ULTRASOUND
TECHNIQUE: Gray scale imaging, color Doppler and duplex ultrasound were
performed of bilateral carotid and vertebral arteries in the neck.

[Series 1: us carotid duplex bilat · 0.06mm/px · 13 of 66 slices shown]
[im 1/66]
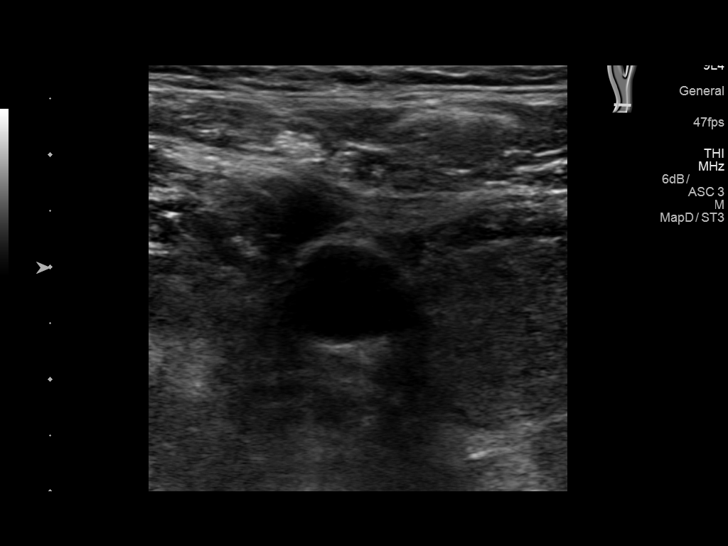
[im 6/66]
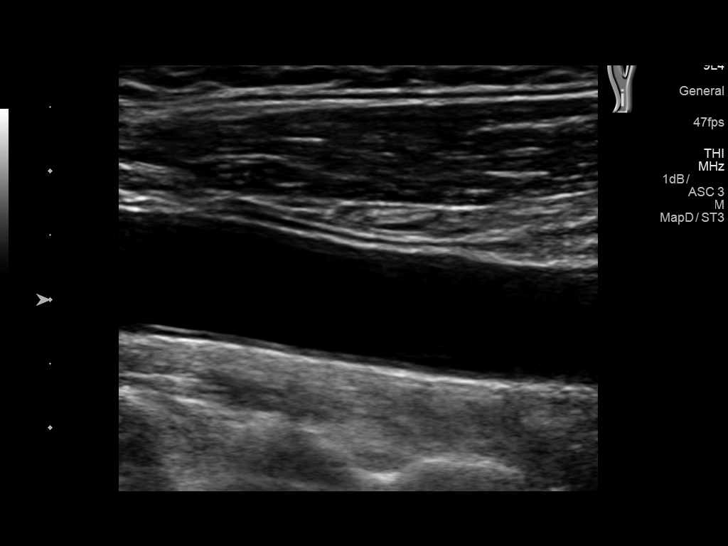
[im 12/66]
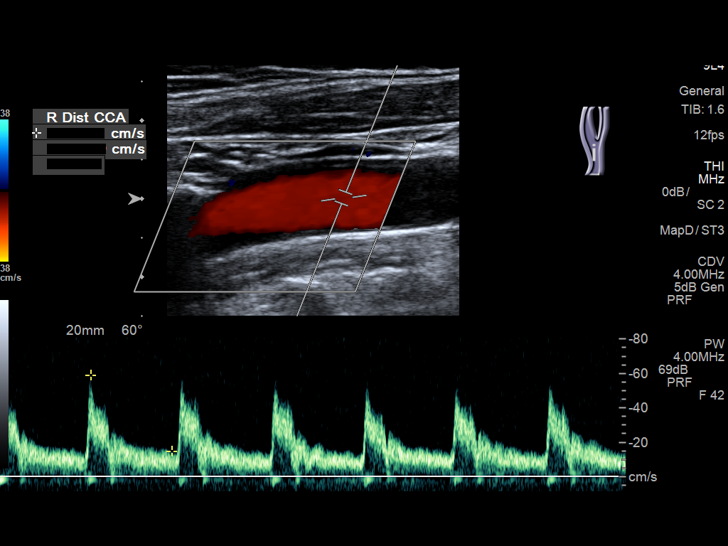
[im 17/66]
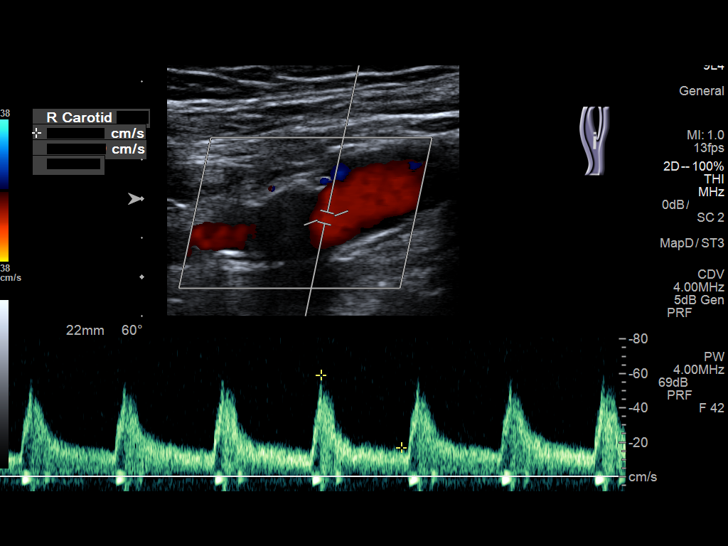
[im 23/66]
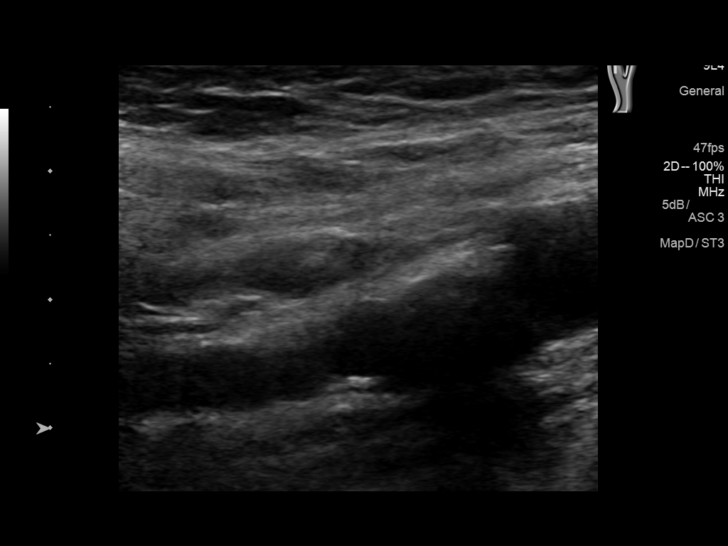
[im 29/66]
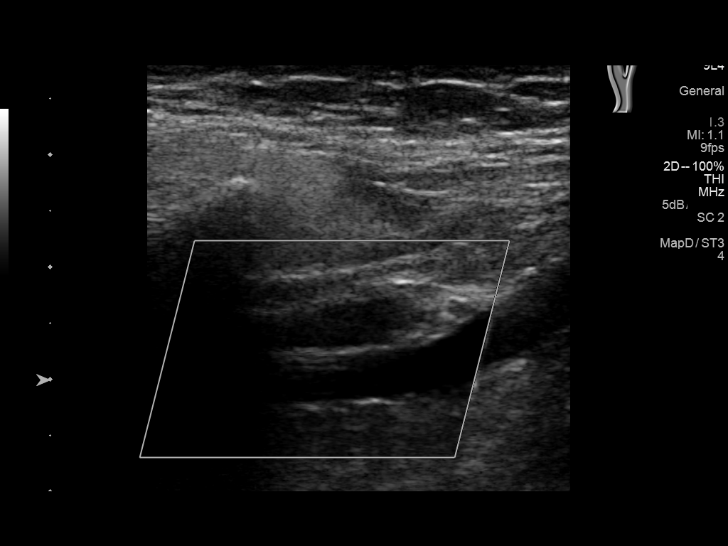
[im 34/66]
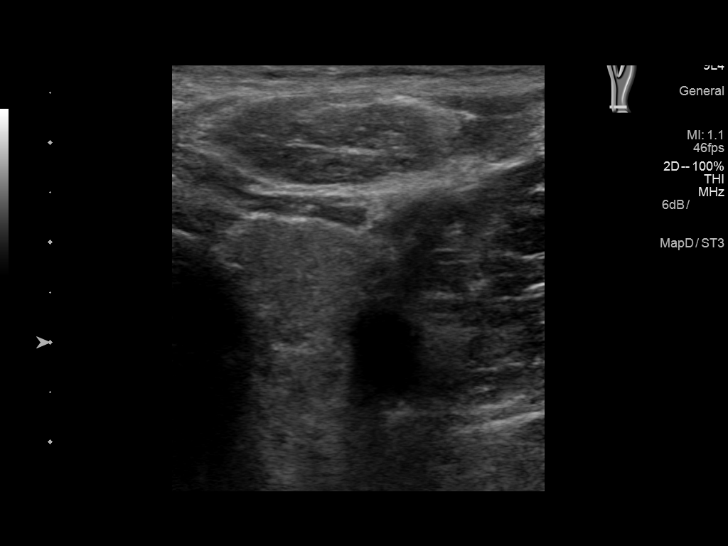
[im 37/66]
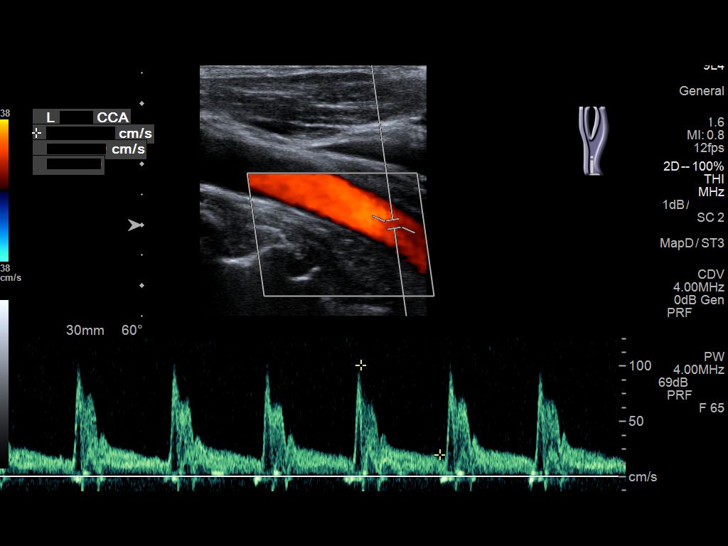
[im 43/66]
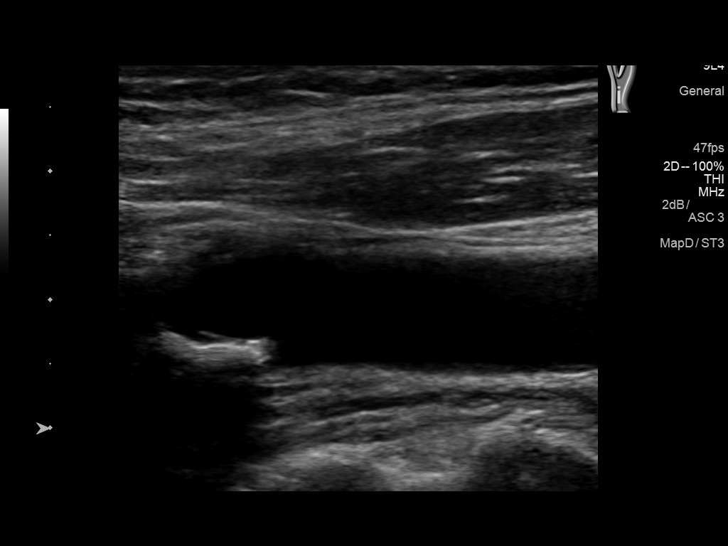
[im 49/66]
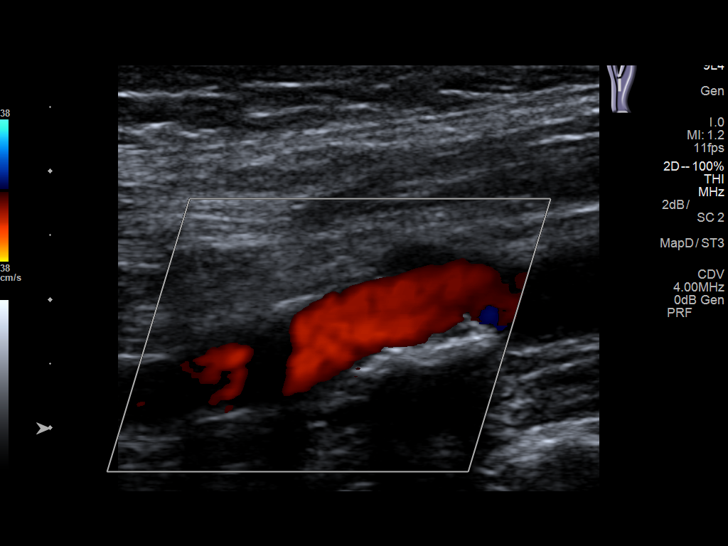
[im 54/66]
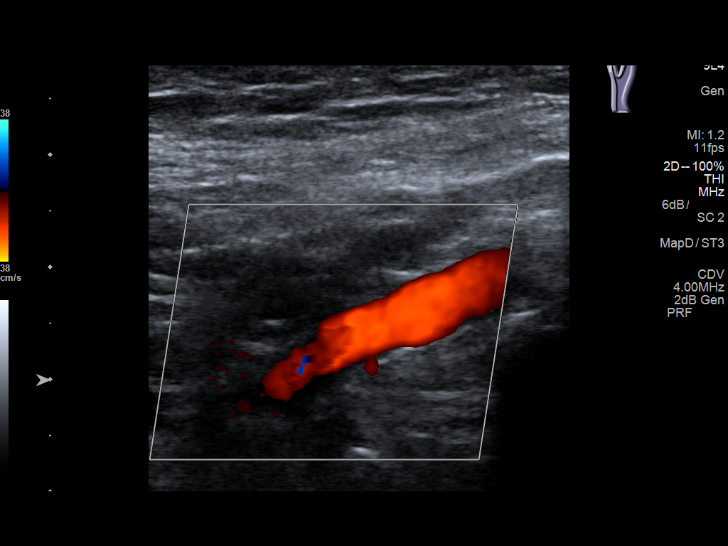
[im 60/66]
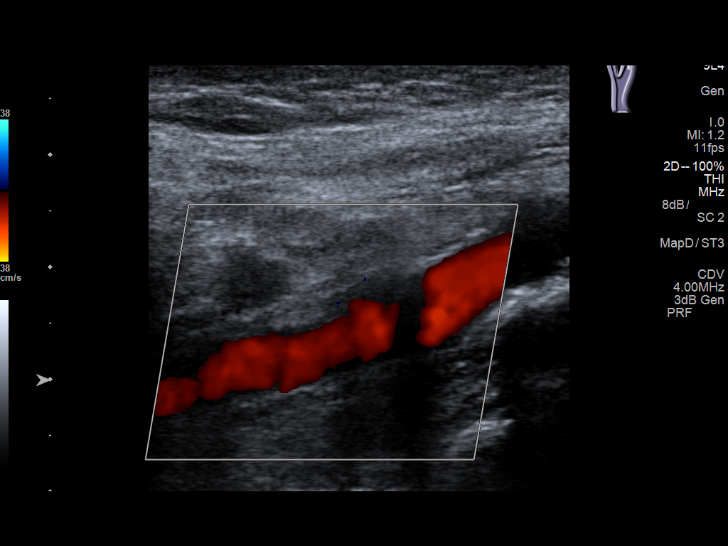
[im 66/66]
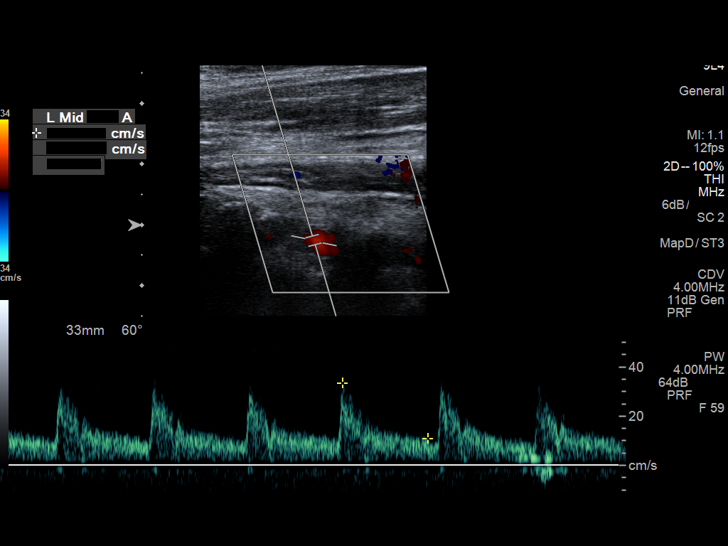

[13 of 24 positions shown; findings below may reference images not displayed]

FINDINGS: Criteria: Quantification of carotid stenosis is based on velocity
parameters that correlate the residual internal carotid diameter
with NASCET-based stenosis levels, using the diameter of the distal
internal carotid lumen as the denominator for stenosis measurement.

The following velocity measurements were obtained:

RIGHT

ICA:  Systolic 73 cm/sec, Diastolic 11 cm/sec

CCA:  73 cm/sec

SYSTOLIC ICA/CCA RATIO:

ECA:  89 cm/sec

LEFT

ICA:  Systolic 72 cm/sec, Diastolic 32 cm/sec

CCA:  85 cm/sec

SYSTOLIC ICA/CCA RATIO:

ECA:  94 cm/sec

Right Brachial SBP: Not acquired

Left Brachial SBP: Not acquired

RIGHT CAROTID ARTERY: No significant calcifications of the right
common carotid artery. Intermediate waveform maintained.
Heterogeneous and partially calcified plaque at the right carotid
bifurcation. No significant lumen shadowing. Low resistance waveform
of the right ICA. No significant tortuosity.

RIGHT VERTEBRAL ARTERY: Antegrade flow with low resistance waveform.

LEFT CAROTID ARTERY: No significant calcifications of the left
common carotid artery. Intermediate waveform maintained.
Heterogeneous and partially calcified plaque at the left carotid
bifurcation without significant lumen shadowing. Low resistance
waveform of the left ICA. No significant tortuosity.

LEFT VERTEBRAL ARTERY:  Antegrade flow with low resistance waveform.
IMPRESSION: Color duplex indicates minimal heterogeneous and calcified plaque,
with no hemodynamically significant stenosis by duplex criteria in
the extracranial cerebrovascular circulation.

## 2019-03-01 ENCOUNTER — Encounter: Payer: Self-pay | Admitting: Internal Medicine

## 2019-03-06 ENCOUNTER — Telehealth: Payer: Self-pay

## 2019-03-06 ENCOUNTER — Other Ambulatory Visit: Payer: Self-pay

## 2019-03-06 DIAGNOSIS — Z8371 Family history of colonic polyps: Secondary | ICD-10-CM

## 2019-03-06 DIAGNOSIS — Z86010 Personal history of colonic polyps: Secondary | ICD-10-CM

## 2019-03-06 NOTE — Telephone Encounter (Signed)
Gastroenterology Pre-Procedure Review  Request Date: Monday 04/09/19 Requesting Physician: Dr. Bonna Gains  PATIENT REVIEW QUESTIONS: The patient responded to the following health history questions as indicated:    1. Are you having any GI issues? no 2. Do you have a personal history of Polyps? yes (2017 [facility]) 3. Do you have a family history of Colon Cancer or Polyps? yes (dad polyps) 4. Diabetes Mellitus? no 5. Joint replacements in the past 12 months?no 6. Major health problems in the past 3 months?no 7. Any artificial heart valves, MVP, or defibrillator?no    MEDICATIONS & ALLERGIES:    Patient reports the following regarding taking any anticoagulation/antiplatelet therapy:   Plavix, Coumadin, Eliquis, Xarelto, Lovenox, Pradaxa, Brilinta, or Effient? yes (Dr. D at office number [phone]) Aspirin? no  Patient confirms/reports the following medications:  Current Outpatient Medications  Medication Sig Dispense Refill  . amLODipine (NORVASC) 5 MG tablet Take 5 mg by mouth daily.    Marland Kitchen atorvastatin (LIPITOR) 40 MG tablet Take 1 tablet (40 mg total) by mouth daily at 6 PM. 30 tablet 0  . benzonatate (TESSALON) 100 MG capsule Take 1 capsule (100 mg total) by mouth every 8 (eight) hours. 21 capsule 0  . bisoprolol-hydrochlorothiazide (ZIAC) 5-6.25 MG tablet Take 1 tablet by mouth daily.    . fluticasone (FLONASE) 50 MCG/ACT nasal spray Place 2 sprays into both nostrils daily. 1 g 0  . ipratropium (ATROVENT) 0.06 % nasal spray Place 2 sprays into both nostrils 4 (four) times daily. 15 mL 12  . pregabalin (LYRICA) 100 MG capsule Take 100 mg by mouth 3 (three) times daily.  4  . vitamin C (ASCORBIC ACID) 500 MG tablet Take 500 mg by mouth daily.     No current facility-administered medications for this visit.     Patient confirms/reports the following allergies:  No Known Allergies  No orders of the defined types were placed in this encounter.   AUTHORIZATION  INFORMATION Primary Insurance: 1D#: Group #:  Secondary Insurance: 1D#: Group #:  SCHEDULE INFORMATION: Date: 04/09/19  Time: Location:[facility]

## 2019-03-12 ENCOUNTER — Telehealth: Payer: Self-pay

## 2019-03-12 NOTE — Telephone Encounter (Signed)
Patient has been advised per Dr. Madelin Rear Blood Thinner Request received to stop Plavix 7 days prior to 04/09/19 Colonoscopy. Restart 1 day after.  Patient expressed understanding and will do as advised.  Thanks [employer]

## 2019-04-05 ENCOUNTER — Other Ambulatory Visit
Admission: RE | Admit: 2019-04-05 | Discharge: 2019-04-05 | Disposition: A | Discharge status: Home / Self Care | Payer: 59 | Source: Ambulatory Visit | Attending: Gastroenterology | Admitting: Gastroenterology

## 2019-04-05 DIAGNOSIS — Z20828 Contact with and (suspected) exposure to other viral communicable diseases: Secondary | ICD-10-CM | POA: Diagnosis not present

## 2019-04-05 DIAGNOSIS — Z01812 Encounter for preprocedural laboratory examination: Secondary | ICD-10-CM | POA: Insufficient documentation

## 2019-04-06 LAB — SARS CORONAVIRUS 2 (TAT 6-24 HRS): SARS Coronavirus 2: NEGATIVE

## 2019-04-09 ENCOUNTER — Ambulatory Visit
Admission: RE | Admit: 2019-04-09 | Discharge: 2019-04-09 | Disposition: A | Discharge status: Home / Self Care | Payer: 59 | Attending: Gastroenterology | Admitting: Gastroenterology

## 2019-04-09 ENCOUNTER — Ambulatory Visit: Payer: 59 | Admitting: Certified Registered"

## 2019-04-09 ENCOUNTER — Encounter: Payer: Self-pay | Admitting: Gastroenterology

## 2019-04-09 ENCOUNTER — Encounter
Admission: RE | Disposition: A | Discharge status: Home / Self Care | Payer: Self-pay | Source: Home / Self Care | Attending: Gastroenterology

## 2019-04-09 ENCOUNTER — Other Ambulatory Visit: Payer: Self-pay

## 2019-04-09 DIAGNOSIS — F17200 Nicotine dependence, unspecified, uncomplicated: Secondary | ICD-10-CM | POA: Diagnosis not present

## 2019-04-09 DIAGNOSIS — K5730 Diverticulosis of large intestine without perforation or abscess without bleeding: Secondary | ICD-10-CM | POA: Insufficient documentation

## 2019-04-09 DIAGNOSIS — Z86010 Personal history of colonic polyps: Secondary | ICD-10-CM | POA: Insufficient documentation

## 2019-04-09 DIAGNOSIS — Z79899 Other long term (current) drug therapy: Secondary | ICD-10-CM | POA: Insufficient documentation

## 2019-04-09 DIAGNOSIS — Z1211 Encounter for screening for malignant neoplasm of colon: Secondary | ICD-10-CM | POA: Insufficient documentation

## 2019-04-09 DIAGNOSIS — Z8371 Family history of colonic polyps: Secondary | ICD-10-CM | POA: Diagnosis not present

## 2019-04-09 DIAGNOSIS — I10 Essential (primary) hypertension: Secondary | ICD-10-CM | POA: Insufficient documentation

## 2019-04-09 DIAGNOSIS — E7800 Pure hypercholesterolemia, unspecified: Secondary | ICD-10-CM | POA: Diagnosis not present

## 2019-04-09 HISTORY — PX: COLONOSCOPY WITH PROPOFOL: SHX5780

## 2019-04-09 HISTORY — DX: Pure hypercholesterolemia, unspecified: E78.00

## 2019-04-09 SURGERY — COLONOSCOPY WITH PROPOFOL
Anesthesia: General

## 2019-04-09 MED ORDER — SODIUM CHLORIDE 0.9 % IV SOLN
INTRAVENOUS | Status: DC
  Administered 2019-04-09: 1000 mL via INTRAVENOUS

## 2019-04-09 MED ORDER — MIDAZOLAM HCL 2 MG/2ML IJ SOLN
INTRAMUSCULAR | Status: AC
  Filled 2019-04-09: qty 2

## 2019-04-09 MED ORDER — LIDOCAINE HCL (PF) 2 % IJ SOLN
INTRAMUSCULAR | Status: AC
  Filled 2019-04-09: qty 10

## 2019-04-09 MED ORDER — FENTANYL CITRATE (PF) 100 MCG/2ML IJ SOLN
INTRAMUSCULAR | Status: DC | PRN
  Administered 2019-04-09 (×2): 50 ug via INTRAVENOUS

## 2019-04-09 MED ORDER — PROPOFOL 500 MG/50ML IV EMUL
INTRAVENOUS | Status: AC
  Filled 2019-04-09: qty 50

## 2019-04-09 MED ORDER — MIDAZOLAM HCL 5 MG/5ML IJ SOLN
INTRAMUSCULAR | Status: DC | PRN
  Administered 2019-04-09: 2 mg via INTRAVENOUS

## 2019-04-09 MED ORDER — PROPOFOL 10 MG/ML IV BOLUS
INTRAVENOUS | Status: DC | PRN
  Administered 2019-04-09: 70 mg via INTRAVENOUS
  Administered 2019-04-09: 50 mg via INTRAVENOUS
  Administered 2019-04-09: 30 mg via INTRAVENOUS

## 2019-04-09 MED ORDER — PROPOFOL 500 MG/50ML IV EMUL
INTRAVENOUS | Status: DC | PRN
  Administered 2019-04-09: 120 ug/kg/min via INTRAVENOUS

## 2019-04-09 MED ORDER — LIDOCAINE 2% (20 MG/ML) 5 ML SYRINGE
INTRAMUSCULAR | Status: DC | PRN
  Administered 2019-04-09: 25 mg via INTRAVENOUS

## 2019-04-09 NOTE — Anesthesia Post-op Follow-up Note (Signed)
Anesthesia QCDR form completed.        

## 2019-04-09 NOTE — Anesthesia Postprocedure Evaluation (Signed)
Anesthesia Post Note  Patient: Greg Poole  Procedure(s) Performed: COLONOSCOPY WITH PROPOFOL (N/A )  Patient location during evaluation: PACU Anesthesia Type: General Level of consciousness: awake and alert Pain management: pain level controlled Vital Signs Assessment: post-procedure vital signs reviewed and stable Respiratory status: spontaneous breathing, nonlabored ventilation and respiratory function stable Cardiovascular status: blood pressure returned to baseline and stable Postop Assessment: no apparent nausea or vomiting Anesthetic complications: no     Last Vitals:  Vitals:   04/09/19 1115 04/09/19 1116  BP: 114/69 114/69  Pulse:  (!) 59  Resp:  14  Temp: (!) 36.4 C (!) 36.2 C  SpO2:  99%    Last Pain:  Vitals:   04/09/19 1145  TempSrc:   PainSc: 0-No pain                 Durenda Hurt

## 2019-04-09 NOTE — Anesthesia Preprocedure Evaluation (Addendum)
Anesthesia Evaluation  Patient identified by MRN, date of birth, ID band Patient awake    Reviewed: Allergy & Precautions, H&P , NPO status , Patient's Chart, lab work & pertinent test results  History of Anesthesia Complications Negative for: history of anesthetic complications  Airway Mallampati: III  TM Distance: >3 FB Neck ROM: full    Dental  (+) Chipped   Pulmonary neg COPD, neg recent URI, Current Smoker,           Cardiovascular hypertension, (-) angina(-) Past MI (-) dysrhythmias      Neuro/Psych TIAnegative psych ROS   GI/Hepatic negative GI ROS, Neg liver ROS,   Endo/Other  negative endocrine ROS  Renal/GU negative Renal ROS  negative genitourinary   Musculoskeletal   Abdominal   Peds  Hematology negative hematology ROS (+)   Anesthesia Other Findings Past Medical History: No date: Hypercholesteremia No date: Hypertension  Past Surgical History: No date: BACK SURGERY 03/10/2018: TEE WITHOUT CARDIOVERSION; N/A     Comment:  Procedure: TRANSESOPHAGEAL ECHOCARDIOGRAM (TEE);                Surgeon: Wellington Hampshire, MD;  Location: [facility] ORS;                Service: Cardiovascular;  Laterality: N/A;  BMI    Body Mass Index: 31.94 kg/m      Reproductive/Obstetrics negative OB ROS                            Anesthesia Physical Anesthesia Plan  ASA: II  Anesthesia Plan: General   Post-op Pain Management:    Induction:   PONV Risk Score and Plan: Propofol infusion and TIVA  Airway Management Planned: Natural Airway and Nasal Cannula  Additional Equipment:   Intra-op Plan:   Post-operative Plan:   Informed Consent: I have reviewed the patients History and Physical, chart, labs and discussed the procedure including the risks, benefits and alternatives for the proposed anesthesia with the patient or authorized representative who has indicated his/her understanding  and acceptance.     Dental Advisory Given  Plan Discussed with: Anesthesiologist  Anesthesia Plan Comments:         Anesthesia Quick Evaluation

## 2019-04-09 NOTE — Op Note (Signed)
[facility] Gastroenterology Patient Name: Greg Poole Procedure Date: 04/09/2019 10:44 AM MRN: [id number] Account #: [id number] Date of Birth: 20-Sep-1955 Admit Type: Outpatient Age: 63 Room: [facility] ENDO ROOM 4 Gender: Male Note Status: Finalized Procedure:             Colonoscopy Indications:           High risk colon cancer surveillance: Personal history                         of colonic polyps Providers:             James Senn B. Maximino Greenland MD, MD Referring MD:          Sallye Lat Md, MD (Referring MD) Medicines:             Monitored Anesthesia Care Complications:         No immediate complications. Procedure:             Pre-Anesthesia Assessment:                        - Prior to the procedure, a History and Physical was                         performed, and patient medications, allergies and                         sensitivities were reviewed. The patient's tolerance                         of previous anesthesia was reviewed.                        - The risks and benefits of the procedure and the                         sedation options and risks were discussed with the                         patient. All questions were answered and informed                         consent was obtained.                        - Patient identification and proposed procedure were                         verified prior to the procedure by the physician, the                         nurse, the anesthetist and the technician. The                         procedure was verified in the pre-procedure area in                         the procedure room in the endoscopy suite.                        -  ASA Grade Assessment: II - A patient with mild                         systemic disease.                        - After reviewing the risks and benefits, the patient                         was deemed in satisfactory condition to undergo the                         procedure.             After obtaining informed consent, the colonoscope was                         passed under direct vision. Throughout the procedure,                         the patient's blood pressure, pulse, and oxygen                         saturations were monitored continuously. The                         Colonoscope was introduced through the anus and                         advanced to the the terminal ileum. The colonoscopy                         was performed with ease. The patient tolerated the                         procedure well. The quality of the bowel preparation                         was good. Findings:      The perianal and digital rectal examinations were normal.      Multiple diverticula were found in the entire colon.      The exam was otherwise without abnormality.      The rectum, sigmoid colon, descending colon, transverse colon, ascending       colon and cecum appeared normal.      The retroflexed view of the distal rectum and anal verge was normal and       showed no anal or rectal abnormalities. Impression:            - Diverticulosis in the entire examined colon.                        - The examination was otherwise normal.                        - The rectum, sigmoid colon, descending colon,                         transverse colon, ascending colon and cecum are normal.                        -  The distal rectum and anal verge are normal on                         retroflexion view.                        - No specimens collected. Recommendation:        - Discharge patient to home.                        - Resume previous diet.                        - Continue present medications.                        - Repeat colonoscopy in 5 years for surveillance.                        - Return to primary care physician as previously                         scheduled.                        - The findings and recommendations were discussed with                          the patient.                        - The findings and recommendations were discussed with                         the patient's family.                        - High fiber diet. Procedure Code(s):     --- Professional ---                        T6546, Colorectal cancer screening; colonoscopy on                         individual at high risk Diagnosis Code(s):     --- Professional ---                        Z86.010, Personal history of colonic polyps CPT copyright 2019 American Medical Association. All rights reserved. The codes documented in this report are preliminary and upon coder review may  be revised to meet current compliance requirements.  Vonda Antigua, MD Margretta Sidle B. Bonna Gains MD, MD 04/09/2019 11:17:10 AM This report has been signed electronically. Number of Addenda: 0 Note Initiated On: 04/09/2019 10:44 AM Scope Withdrawal Time: 0 hours 10 minutes 19 seconds  Total Procedure Duration: 0 hours 14 minutes 41 seconds       [facility]

## 2019-04-09 NOTE — H&P (Signed)
Vonda Antigua, MD [address], [place], [place], [place], [address] [address], [place], [place], [place], [address] Phone: [phone]  Fax: [phone]  Primary Care Physician:  Morey Hummingbird, MD   Pre-Procedure History & Physical: HPI:  Greg Poole is a 63 y.o. male is here for a colonoscopy.   Past Medical History:  Diagnosis Date  . Hypercholesteremia   . Hypertension     Past Surgical History:  Procedure Laterality Date  . BACK SURGERY    . TEE WITHOUT CARDIOVERSION N/A 03/10/2018   Procedure: TRANSESOPHAGEAL ECHOCARDIOGRAM (TEE);  Surgeon: Wellington Hampshire, MD;  Location: [facility] ORS;  Service: Cardiovascular;  Laterality: N/A;    Prior to Admission medications   Medication Sig Start Date End Date Taking? Authorizing Provider  bisoprolol-hydrochlorothiazide (ZIAC) 5-6.25 MG tablet Take 1 tablet by mouth daily.   Yes [provider]  amLODipine (NORVASC) 5 MG tablet Take 5 mg by mouth daily.    [provider]  atorvastatin (LIPITOR) 40 MG tablet Take 1 tablet (40 mg total) by mouth daily at 6 PM. 03/08/18   Mayo, Pete Pelt, MD  benzonatate (TESSALON) 100 MG capsule Take 1 capsule (100 mg total) by mouth every 8 (eight) hours. 07/13/18   Tasia Catchings, Amy V, PA-C  fluticasone (FLONASE) 50 MCG/ACT nasal spray Place 2 sprays into both nostrils daily. 07/13/18   Tasia Catchings, Amy V, PA-C  ipratropium (ATROVENT) 0.06 % nasal spray Place 2 sprays into both nostrils 4 (four) times daily. 07/13/18   Tasia Catchings, Amy V, PA-C  pregabalin (LYRICA) 100 MG capsule Take 100 mg by mouth 3 (three) times daily. 02/09/18   [provider]  vitamin C (ASCORBIC ACID) 500 MG tablet Take 500 mg by mouth daily.    [provider]    Allergies as of 03/06/2019  . (No Known Allergies)    Family History  Problem Relation Age of Onset  . Stroke Mother     Social History   Socioeconomic History  . Marital status: Married    Spouse name: Not on file  . Number of  children: 0  . Years of education: Not on file  . Highest education level: Not on file  Occupational History  . Not on file  Tobacco Use  . Smoking status: Current Every Day Smoker  . Smokeless tobacco: Never Used  Substance and Sexual Activity  . Alcohol use: Yes  . Drug use: Never  . Sexual activity: Yes  Other Topics Concern  . Not on file  Social History Narrative   Live at home alone during week; wife home on weekends   Social Determinants of Health   Financial Resource Strain:   . Difficulty of Paying Living Expenses: Not on file  Food Insecurity:   . Worried About Charity fundraiser in the Last Year: Not on file  . Ran Out of Food in the Last Year: Not on file  Transportation Needs:   . Lack of Transportation (Medical): Not on file  . Lack of Transportation (Non-Medical): Not on file  Physical Activity:   . Days of Exercise per Week: Not on file  . Minutes of Exercise per Session: Not on file  Stress:   . Feeling of Stress : Not on file  Social Connections:   . Frequency of Communication with Friends and Family: Not on file  . Frequency of Social Gatherings with Friends and Family: Not on file  . Attends Religious Services: Not on file  . Active  Member of Clubs or Organizations: Not on file  . Attends Banker Meetings: Not on file  . Marital Status: Not on file  Intimate Partner Violence:   . Fear of Current or Ex-Partner: Not on file  . Emotionally Abused: Not on file  . Physically Abused: Not on file  . Sexually Abused: Not on file    Review of Systems: See HPI, otherwise negative ROS  Physical Exam: BP 123/87   Pulse 75   Temp 98.6 F (37 C) (Skin)   Resp 18   Ht 6' (1.829 m)   Wt 106.8 kg   SpO2 97%   BMI 31.94 kg/m  General:   Alert,  pleasant and cooperative in NAD Head:  Normocephalic and atraumatic. Neck:  Supple; no masses or thyromegaly. Lungs:  Clear throughout to auscultation, normal respiratory effort.    Heart:  +S1,  +S2, Regular rate and rhythm, No edema. Abdomen:  Soft, nontender and nondistended. Normal bowel sounds, without guarding, and without rebound.   Neurologic:  Alert and  oriented x4;  grossly normal neurologically.  Impression/Plan: Greg Poole is here for a colonoscopy to be performed for polyp surveillance. 2017 colonoscopy with 4 polyps removed, largest 8 mm in size. Path showed tubular adenoma and hyperplastic polyps.   Risks, benefits, limitations, and alternatives regarding  colonoscopy have been reviewed with the patient.  Questions have been answered.  All parties agreeable.   Pasty Spillers, MD  04/09/2019, 10:46 AM

## 2019-04-09 NOTE — Transfer of Care (Signed)
Immediate Anesthesia Transfer of Care Note  Patient: Greg Poole  Procedure(s) Performed: COLONOSCOPY WITH PROPOFOL (N/A )  Patient Location: Endoscopy Unit  Anesthesia Type:General  Level of Consciousness: sedated  Airway & Oxygen Therapy: Patient Spontanous Breathing and Patient connected to nasal cannula oxygen  Post-op Assessment: Report given to RN and Post -op Vital signs reviewed and stable  Post vital signs: Reviewed  Last Vitals:  Vitals Value Taken Time  BP 114/69 04/09/19 1116  Temp 36.2 C 04/09/19 1116  Pulse 59 04/09/19 1116  Resp 13 04/09/19 1116  SpO2 98 % 04/09/19 1116  Vitals shown include unvalidated device data.  Last Pain:  Vitals:   04/09/19 1115  TempSrc: Temporal  PainSc:          Complications: No apparent anesthesia complications

## 2019-04-10 ENCOUNTER — Encounter: Payer: Self-pay | Admitting: *Deleted

## 2020-06-21 IMAGING — CT CT HEAD W/O CM
3 series · 15 of 47 positions shown, 18 images · non-contrast
Comparison: None.

CLINICAL DATA: Visual loss in the right eye.

EXAM:
CT HEAD WITHOUT CONTRAST
TECHNIQUE: Contiguous axial images were obtained from the base of the skull
through the vertex without intravenous contrast.

[Series 2: head wo · axial · 0.44mm/px · z∈[-111,+14]mm · 9 of 31 slices shown, 12 images]
[im 3/31  brain]
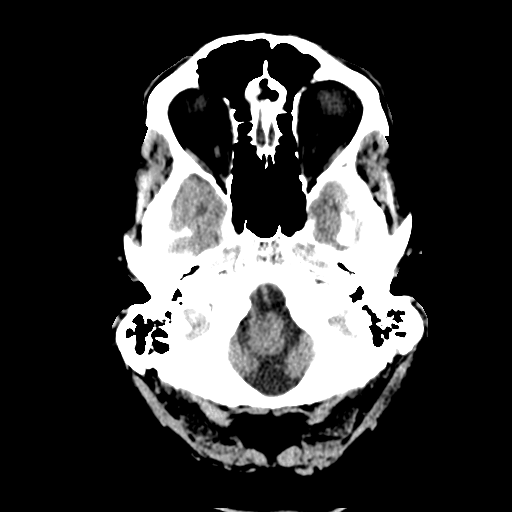
[im 3/31  bone]
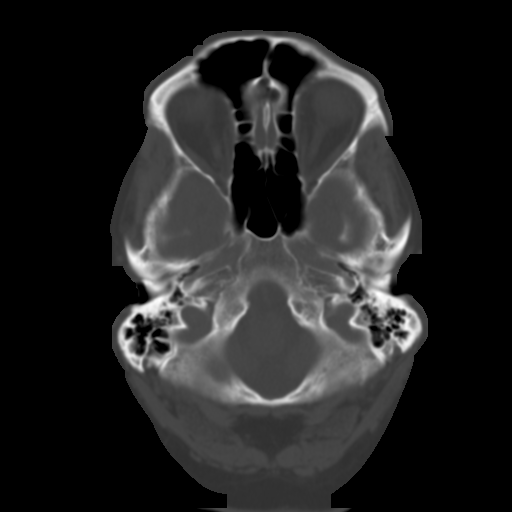
[im 6/31  brain]
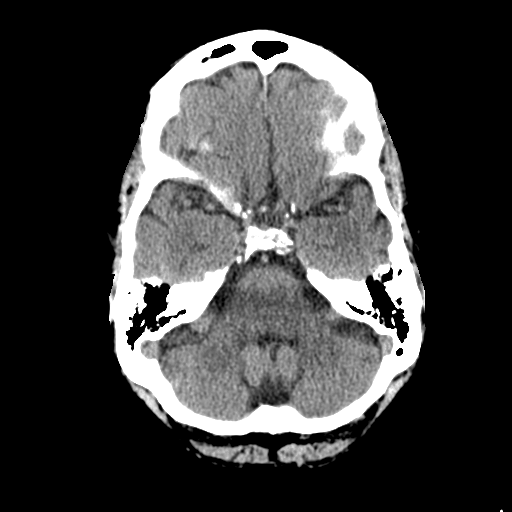
[im 9/31  brain]
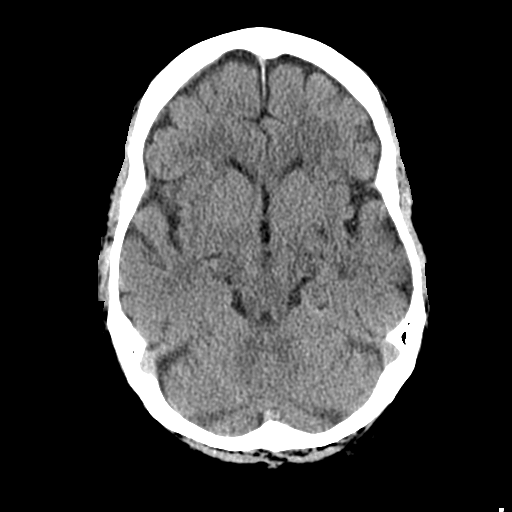
[im 12/31  brain]
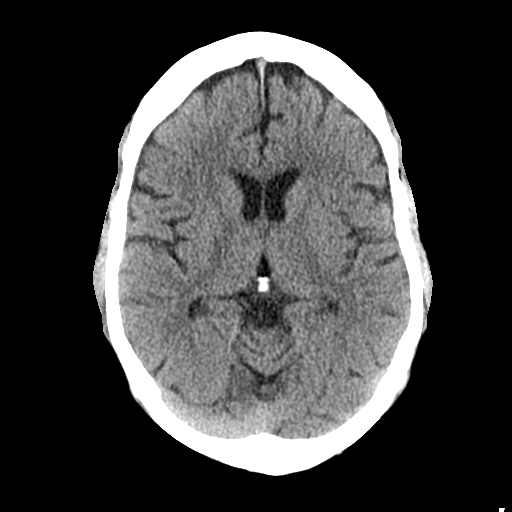
[im 16/31  brain]
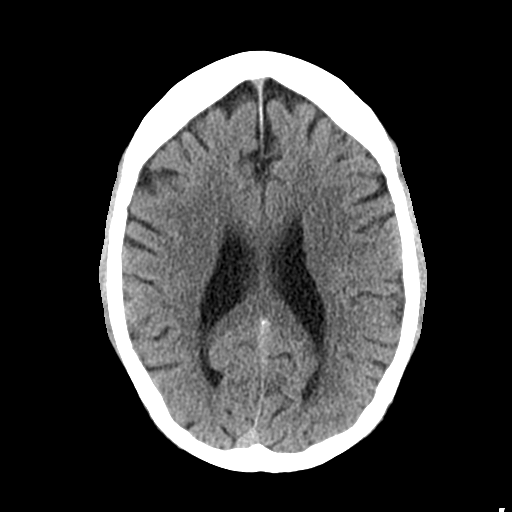
[im 16/31  bone]
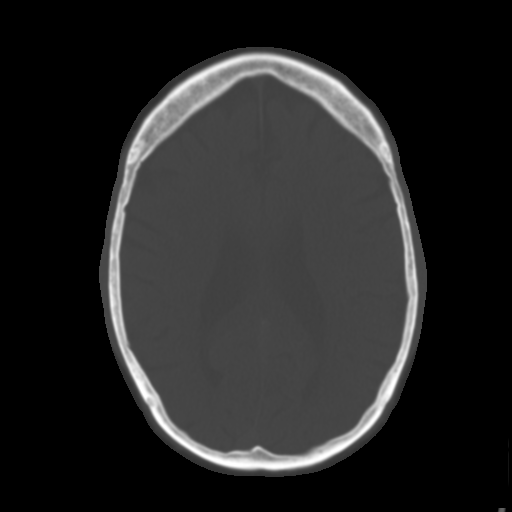
[im 19/31  brain]
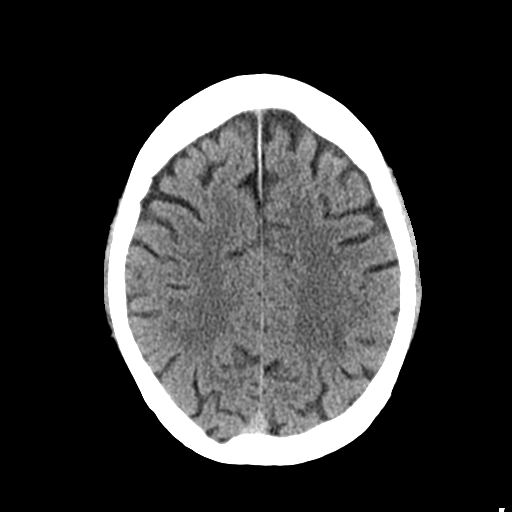
[im 22/31  brain]
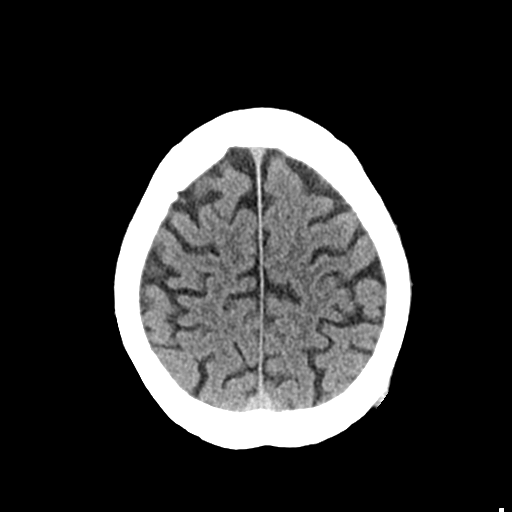
[im 25/31  brain]
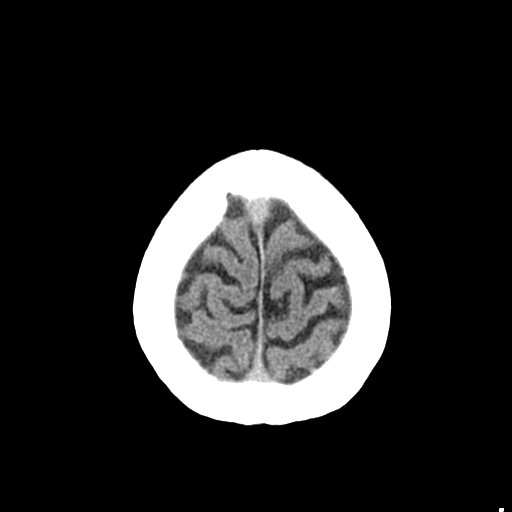
[im 28/31  brain]
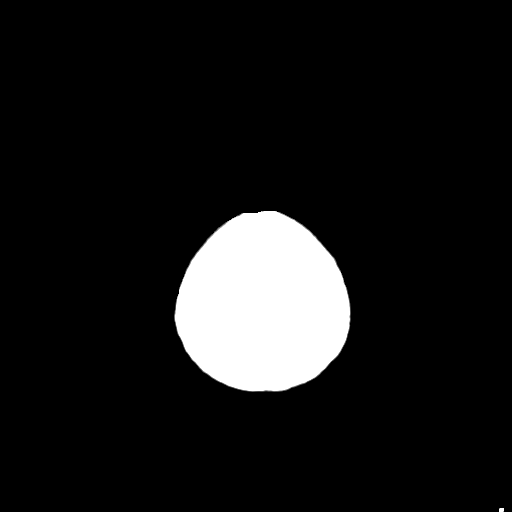
[im 28/31  bone]
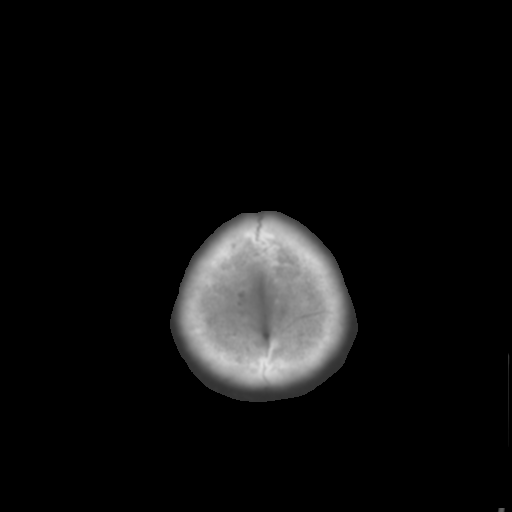

[Series 4: coronal soft tissue · coronal · 0.31mm/px · 3 of 66 slices shown]
[im 22/66  brain]
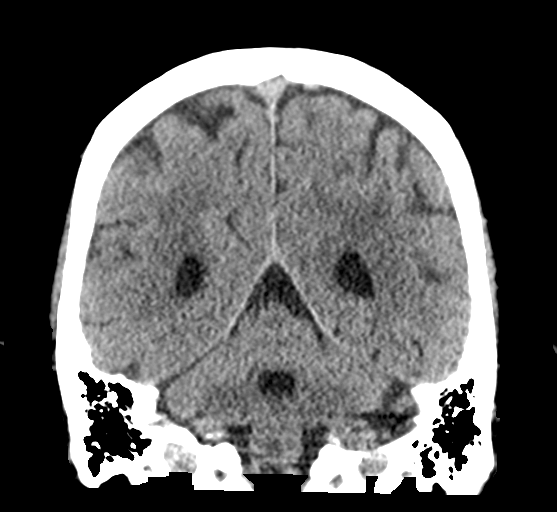
[im 29/66  brain]
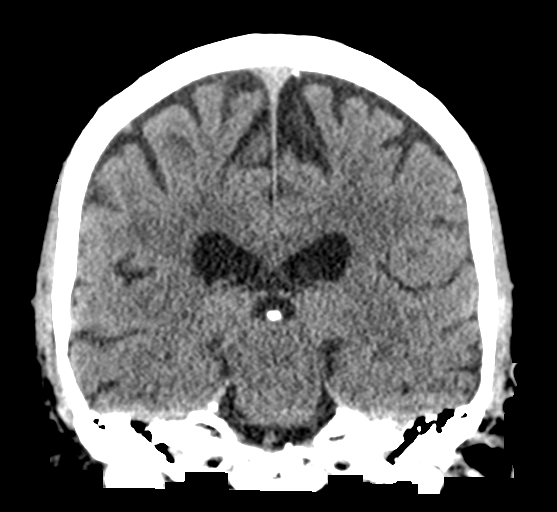
[im 37/66  brain]
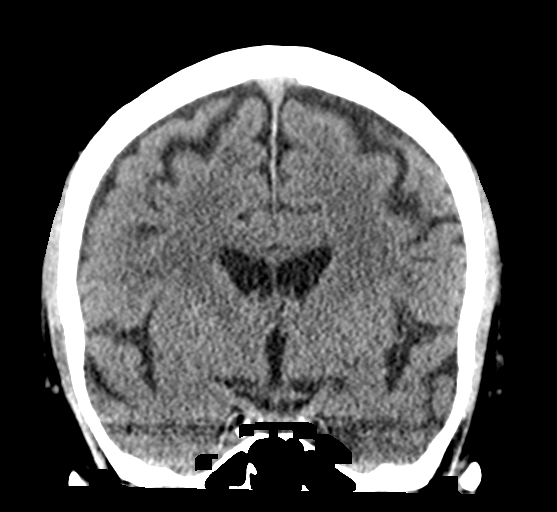

[Series 5: sagittal soft tissue · sagittal · 0.30mm/px · 3 of 52 slices shown]
[im 18/52  brain]
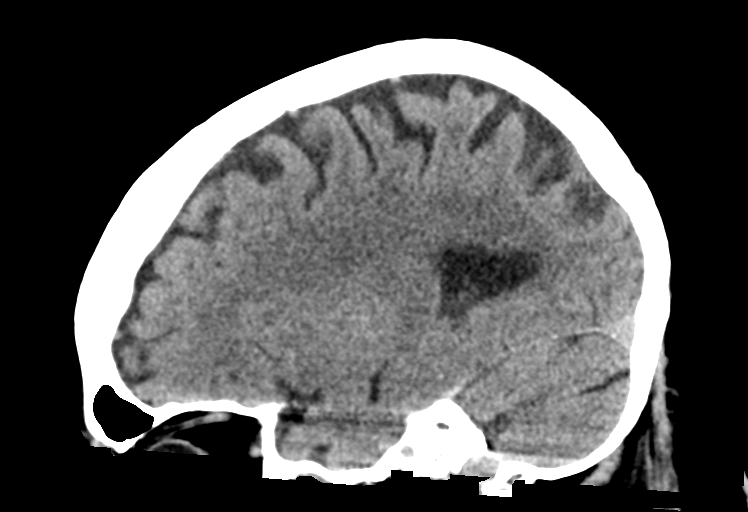
[im 26/52  brain]
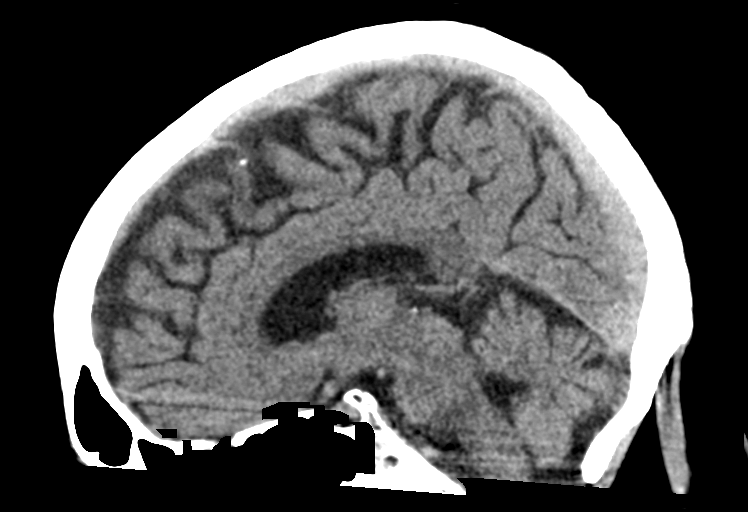
[im 35/52  brain]
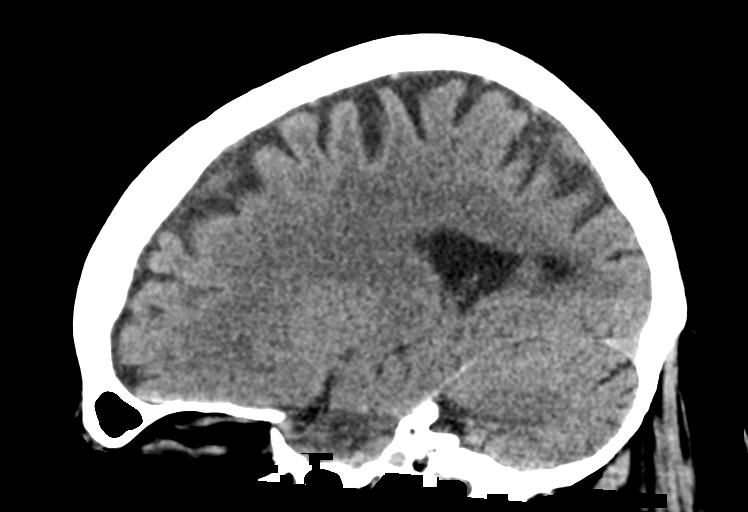

[15 of 47 positions shown; findings below may reference images not displayed]

FINDINGS: Brain: No evidence of acute infarction, hemorrhage, hydrocephalus,
extra-axial collection or mass lesion/mass effect. Minimal small
vessel ischemic disease of periventricular white matter, age
indeterminate given lack of prior studies for comparison.

Vascular: No hyperdense vessel or unexpected calcification.

Skull: Normal. Negative for fracture or focal lesion.

Sinuses/Orbits: No acute finding. The globes are incompletely imaged
on this head CT. That which is included is unremarkable.

Other: None.
IMPRESSION: Minimal small vessel ischemic disease periventricular white matter.
No acute intracranial abnormality. No CT findings to explain the
patient's right-sided visual loss.

## 2022-11-08 ENCOUNTER — Other Ambulatory Visit: Payer: Self-pay | Admitting: Internal Medicine

## 2022-11-08 DIAGNOSIS — R0609 Other forms of dyspnea: Secondary | ICD-10-CM

## 2022-11-08 DIAGNOSIS — F17210 Nicotine dependence, cigarettes, uncomplicated: Secondary | ICD-10-CM

## 2022-11-16 ENCOUNTER — Encounter: Payer: Self-pay | Admitting: Internal Medicine

## 2022-11-17 ENCOUNTER — Ambulatory Visit
Admission: RE | Admit: 2022-11-17 | Discharge: 2022-11-17 | Disposition: A | Discharge status: Home / Self Care | Payer: Medicare HMO | Source: Ambulatory Visit | Attending: Internal Medicine | Admitting: Internal Medicine

## 2022-11-17 DIAGNOSIS — R0609 Other forms of dyspnea: Secondary | ICD-10-CM

## 2022-11-17 DIAGNOSIS — F17210 Nicotine dependence, cigarettes, uncomplicated: Secondary | ICD-10-CM

## 2022-11-25 ENCOUNTER — Other Ambulatory Visit (HOSPITAL_BASED_OUTPATIENT_CLINIC_OR_DEPARTMENT_OTHER): Payer: Self-pay | Admitting: Internal Medicine

## 2022-11-25 DIAGNOSIS — I2510 Atherosclerotic heart disease of native coronary artery without angina pectoris: Secondary | ICD-10-CM

## 2022-12-10 ENCOUNTER — Ambulatory Visit (HOSPITAL_COMMUNITY)
Admission: RE | Admit: 2022-12-10 | Discharge: 2022-12-10 | Disposition: A | Discharge status: Home / Self Care | Payer: Medicare HMO | Source: Ambulatory Visit | Attending: Internal Medicine | Admitting: Internal Medicine

## 2022-12-10 DIAGNOSIS — I2584 Coronary atherosclerosis due to calcified coronary lesion: Secondary | ICD-10-CM | POA: Insufficient documentation

## 2022-12-10 DIAGNOSIS — I2510 Atherosclerotic heart disease of native coronary artery without angina pectoris: Secondary | ICD-10-CM

## 2023-03-08 ENCOUNTER — Ambulatory Visit: Payer: Medicare HMO | Attending: Internal Medicine | Admitting: Internal Medicine

## 2023-03-08 ENCOUNTER — Encounter: Payer: Self-pay | Admitting: Internal Medicine

## 2023-03-08 VITALS — BP 124/70 | HR 63 | Ht 72.0 in | Wt 235.6 lb

## 2023-03-08 DIAGNOSIS — R0609 Other forms of dyspnea: Secondary | ICD-10-CM

## 2023-03-08 DIAGNOSIS — G459 Transient cerebral ischemic attack, unspecified: Secondary | ICD-10-CM

## 2023-03-08 NOTE — Patient Instructions (Signed)
Medication Instructions:   *If you need a refill on your cardiac medications before your next appointment, please call your pharmacy*   Lab Work: HGBA1C, NMR, APO B, LIPO A If you have labs (blood work) drawn today and your tests are completely normal, you will receive your results only by: MyChart Message (if you have MyChart) OR A paper copy in the mail If you have any lab test that is abnormal or we need to change your treatment, we will call you to review the results.   Testing/Procedures: How to Prepare for Your Cardiac PET/CT Stress Test:  1. Please do not take these medications before your test:   Medications that may interfere with the cardiac pharmacological stress agent. (Erectile dysfunction medication should be held for at least 72 hrs prior to test) Your remaining medications may be taken with water.  2. Nothing to eat or drink, except water, 3 hours prior to arrival time.   NO caffeine/decaffeinated products, or chocolate 12 hours prior to arrival.  3. NO perfume, cologne or lotion on chest or abdomen area.           4. Total time is 1 to 2 hours; you may want to bring reading material for the waiting time.  5. Please report to Radiology at the [facility] Main Entrance 30 minutes early for your test.  [address] [place], [place] [address]  6. Please report to Radiology at [facility] Main Entrance, medical mall, 30 mins prior to your test.  [address]  [place], [place]  [phone]   In preparation for your appointment, medication and supplies will be purchased.  Appointment availability is limited, so if you need to cancel or reschedule, please call the Radiology Department at [phone] [facility]) OR [phone] [facility])  24 hours in advance to avoid a cancellation fee of $100.00  What to Expect After you Arrive:  Once you arrive and check in for your appointment, you will be taken to a preparation room  within the Radiology Department.  A technologist or Nurse will obtain your medical history, verify that you are correctly prepped for the exam, and explain the procedure.  Afterwards,  an IV will be started in your arm and electrodes will be placed on your skin for EKG monitoring during the stress portion of the exam. Then you will be escorted to the PET/CT scanner.  There, staff will get you positioned on the scanner and obtain a blood pressure and EKG.  During the exam, you will continue to be connected to the EKG and blood pressure machines.  A small, safe amount of a radioactive tracer will be injected in your IV to obtain a series of pictures of your heart along with an injection of a stress agent.    After your Exam:  It is recommended that you eat a meal and drink a caffeinated beverage to counter act any effects of the stress agent.  Drink plenty of fluids for the remainder of the day and urinate frequently for the first couple of hours after the exam.  Your doctor will inform you of your test results within 7-10 business days.  For more information and frequently asked questions, please visit our website : [email or web address]  For questions about your test or how to prepare for your test, please call: Cardiac Imaging Nurse Navigators Office: [phone]    Follow-Up: At [facility], you and your health needs are our priority.  As part  of our continuing mission to provide you with exceptional heart care, we have created designated Provider Care Teams.  These Care Teams include your primary Cardiologist (physician) and Advanced Practice Providers (APPs -  Physician Assistants and Nurse Practitioners) who all work together to provide you with the care you need, when you need it.  We recommend signing up for the patient portal called "MyChart".  Sign up information is provided on this After Visit Summary.  MyChart is used to connect with patients for Virtual Visits  (Telemedicine).  Patients are able to view lab/test results, encounter notes, upcoming appointments, etc.  Non-urgent messages can be sent to your provider as well.   To learn more about what you can do with MyChart, go to [email or web address].

## 2023-03-08 NOTE — Progress Notes (Unsigned)
Cardiology Office Note   Date:  03/08/2023   ID:  Greg Poole, DOB November 12, 1955, MRN [id number]  PCP:  Greg Hensen, MD  Cardiologist:   Dietrich Pates, MD   Pt referred for eval of CAD as seen on CT scan    History of Present Illness: Greg Poole is a 67 y.o. male with a history of HTN, TIA and HL About 6 months ago had SOB   At that time was 250 lbs  Smoking cigarettes    He has quit, lost some weight  He says SOB has improved some, though does still get SOB with activity  Not as bad   Denies palpitations   No CP   He does  little walking   Cuts grss on riding mower  He had a calcium score CT in [place]   Score 1666 (96%ile for age/sex)   Current Meds  Medication Sig   amLODipine (NORVASC) 5 MG tablet Take 5 mg by mouth daily.   atorvastatin (LIPITOR) 40 MG tablet Take 1 tablet (40 mg total) by mouth daily at 6 PM.   bisoprolol-hydrochlorothiazide (ZIAC) 5-6.25 MG tablet Take 1 tablet by mouth daily.   clopidogrel (PLAVIX) 75 MG tablet Take 75 mg by mouth daily.   gabapentin (NEURONTIN) 300 MG capsule Take 300 mg by mouth 3 (three) times daily.   pregabalin (LYRICA) 100 MG capsule Take 100 mg by mouth 3 (three) times daily.   vitamin C (ASCORBIC ACID) 500 MG tablet Take 500 mg by mouth daily.   [DISCONTINUED] benzonatate (TESSALON) 100 MG capsule Take 1 capsule (100 mg total) by mouth every 8 (eight) hours.   [DISCONTINUED] fluticasone (FLONASE) 50 MCG/ACT nasal spray Place 2 sprays into both nostrils daily.   [DISCONTINUED] ipratropium (ATROVENT) 0.06 % nasal spray Place 2 sprays into both nostrils 4 (four) times daily.     Allergies:   Patient has no known allergies.   Past Medical History:  Diagnosis Date   Degeneration of lumbar intervertebral disc    Hypercholesteremia    Hyperlipidemia    Hypertension    Leukocytosis, unspecified type    Neuropathy    Obesity    Osteoarthritis of knee    Retinal artery occlusion    Tobacco use     Past Surgical  History:  Procedure Laterality Date   BACK SURGERY     COLONOSCOPY WITH PROPOFOL N/A 04/09/2019   Procedure: COLONOSCOPY WITH PROPOFOL;  Surgeon: Pasty Spillers, MD;  Location: [facility] ENDOSCOPY;  Service: Endoscopy;  Laterality: N/A;   TEE WITHOUT CARDIOVERSION N/A 03/10/2018   Procedure: TRANSESOPHAGEAL ECHOCARDIOGRAM (TEE);  Surgeon: Iran Ouch, MD;  Location: [facility] ORS;  Service: Cardiovascular;  Laterality: N/A;     Social History:  The patient  reports that he has been smoking. He has never used smokeless tobacco. He reports current alcohol use. He reports that he does not use drugs.   Quit  Had smoked about 2 packs per week  All life  Quit summer 2024     Family History:  The patient's family history includes Stroke in his mother.    ROS:  Please see the history of present illness. All other systems are reviewed and  Negative to the above problem except as noted.    PHYSICAL EXAM: VS:  BP 124/70   Pulse 63   Ht 6' (1.829 m)   Wt 235 lb 9.6 oz (106.9 kg)   SpO2 98%   BMI 31.95 kg/m   GEN: Obese  67 yo  in no acute distress  HEENT: normal  Neck: no JVD, carotid bruits Cardiac: Irreg ; no murmurs  No LE edema  Respiratory:  clear to auscultation  GI: soft, nontender  No hepatomegaly  MS: no deformity Moving all extremities   Neuro:  Strength and sensation are intact Psych: euthymic mood, full affect   EKG:  EKG is ordered today.  Atrial fibrillation 63 bpm Nonspecific ST changese   Calcium score CT  Aug 2024  Ascending Aorta: Upper normal diameter 3.7 cm Pericardium: Norma Coronary arteries: LM and severe 3 vessel coronary calcium LM 206 LAD 361 LCX 191 RCA 907 Total 1666   IMPRESSION: Coronary calcium score of 1666. This was 18 percentile for age and sex matched control.  Diet:   Br:  Skip   Coffee  Black Lunch:  Skip lunch   Water  Dinner:  Salad   Protein  Veggies      Snacks  pork rinds      Lipid Panel    Component Value Date/Time    CHOL 202 (H) 03/08/2018 0512   TRIG 111 03/08/2018 0512   HDL 48 03/08/2018 0512   CHOLHDL 4.2 03/08/2018 0512   VLDL 22 03/08/2018 0512   LDLCALC 132 (H) 03/08/2018 0512      Wt Readings from Last 3 Encounters:  03/08/23 235 lb 9.6 oz (106.9 kg)  04/09/19 235 lb 7.6 oz (106.8 kg)  03/10/18 99 lb 6.4 oz (45.1 kg)      ASSESSMENT AND PLAN:  1 CAD  Pt with Ca score of over 1666    Denies CP  But does have SOB with activity     Would recomm PET/CT stress test to evaluate for ischemia/flow reserve Rx risk factors  2  3.  HTN  BP is controlled     4  HL LDL 72  HDL 50  in Dec 2023  Will get lipomed, lpa and Apo B  Current medicines are reviewed at length with the patient today.  The patient does not have concerns regarding medicines.  Signed, Dietrich Pates, MD  03/08/2023 10:15 AM    [facility] [address] [place], [place], [place]  [address] Phone: [phone]; Fax: [phone]

## 2023-03-09 ENCOUNTER — Telehealth: Payer: Self-pay

## 2023-03-09 ENCOUNTER — Encounter: Payer: Self-pay | Admitting: Internal Medicine

## 2023-03-09 ENCOUNTER — Ambulatory Visit: Payer: Medicare HMO | Attending: Internal Medicine

## 2023-03-09 ENCOUNTER — Other Ambulatory Visit: Payer: Self-pay

## 2023-03-09 DIAGNOSIS — G459 Transient cerebral ischemic attack, unspecified: Secondary | ICD-10-CM

## 2023-03-09 DIAGNOSIS — I4891 Unspecified atrial fibrillation: Secondary | ICD-10-CM

## 2023-03-09 DIAGNOSIS — I2510 Atherosclerotic heart disease of native coronary artery without angina pectoris: Secondary | ICD-10-CM

## 2023-03-09 LAB — HEMOGLOBIN A1C
Est. average glucose Bld gHb Est-mCnc: 120 mg/dL
Hgb A1c MFr Bld: 5.8 % — ABNORMAL HIGH (ref 4.8–5.6)

## 2023-03-09 LAB — LIPOPROTEIN A (LPA): Lipoprotein (a): <8.4 nmol/L (ref <75.0)

## 2023-03-09 LAB — NMR, LIPOPROFILE
Cholesterol, Total: 132 mg/dL (ref 100–199)
HDL Particle Number: 34.8 umol/L (ref >=30.5)
HDL-C: 50 mg/dL (ref >39)
LDL Particle Number: 739 nmol/L (ref <1000)
LDL Size: 20.3 nm — ABNORMAL LOW (ref >20.5)
LDL-C (NIH Calc): 58 mg/dL (ref 0–99)
LP-IR Score: 65 — ABNORMAL HIGH (ref <=45)
Small LDL Particle Number: 396 nmol/L (ref <=527)
Triglycerides: 136 mg/dL (ref 0–149)

## 2023-03-09 LAB — APOLIPOPROTEIN B: Apolipoprotein B: 63 mg/dL (ref <90)

## 2023-03-09 MED ORDER — RIVAROXABAN 10 MG PO TABS: 10.0000 mg | ORAL_TABLET | Freq: Every day | ORAL | 3 refills | Status: DC

## 2023-03-09 MED ORDER — RIVAROXABAN 20 MG PO TABS: 20.0000 mg | ORAL_TABLET | Freq: Every day | ORAL | 3 refills | Status: DC

## 2023-03-09 MED ORDER — RIVAROXABAN 20 MG PO TABS: 20.0000 mg | ORAL_TABLET | Freq: Every day | ORAL | 0 refills | Status: DC

## 2023-03-09 MED ORDER — RIVAROXABAN 20 MG PO TABS: 20.0000 mg | ORAL_TABLET | Freq: Every day | ORAL | Status: DC

## 2023-03-09 NOTE — Progress Notes (Unsigned)
Enrolled for [employer] to mail a ZIO XT long term holter monitor to the patients address on file.  

## 2023-03-09 NOTE — Telephone Encounter (Addendum)
Pt advised and verbalized understanding.   Per Dr Tenny Craw pt is in Afib...  D/C Plavix  Start Xarelto daily (samples left at the front desk for him Echo 3 DAY ZIO for rate   All of this summarized in My Chart for the patient.

## 2023-03-09 NOTE — Addendum Note (Signed)
Addended by: Bertram Millard on: 03/09/2023 11:43 AM   Modules accepted: Orders

## 2023-03-10 ENCOUNTER — Ambulatory Visit (HOSPITAL_COMMUNITY): Payer: Medicare HMO | Attending: Cardiology

## 2023-03-10 DIAGNOSIS — I4891 Unspecified atrial fibrillation: Secondary | ICD-10-CM | POA: Diagnosis not present

## 2023-03-10 DIAGNOSIS — G459 Transient cerebral ischemic attack, unspecified: Secondary | ICD-10-CM | POA: Insufficient documentation

## 2023-03-10 LAB — ECHOCARDIOGRAM COMPLETE
AR max vel: 2.1 cm2
AV Area VTI: 2.06 cm2
AV Area mean vel: 1.94 cm2
AV Mean grad: 5.5 mm[Hg]
AV Peak grad: 10.2 mm[Hg]
Ao pk vel: 1.6 m/s
Area-P 1/2: 3.1 cm2
Calc EF: 50.7 %
MV M vel: 5.18 m/s
MV Peak grad: 107.3 mm[Hg]
MV VTI: 2.16 cm2
S' Lateral: 3.45 cm
Single Plane A2C EF: 49.2 %
Single Plane A4C EF: 52.5 %

## 2023-03-10 NOTE — Telephone Encounter (Signed)
Sign order for PET CT

## 2023-03-13 DIAGNOSIS — G459 Transient cerebral ischemic attack, unspecified: Secondary | ICD-10-CM | POA: Diagnosis not present

## 2023-03-13 DIAGNOSIS — I4891 Unspecified atrial fibrillation: Secondary | ICD-10-CM

## 2023-03-15 ENCOUNTER — Encounter (HOSPITAL_COMMUNITY): Payer: Self-pay

## 2023-03-17 ENCOUNTER — Ambulatory Visit
Admission: RE | Admit: 2023-03-17 | Discharge: 2023-03-17 | Disposition: A | Discharge status: Home / Self Care | Payer: Medicare HMO | Source: Ambulatory Visit | Attending: Internal Medicine | Admitting: Internal Medicine

## 2023-03-17 DIAGNOSIS — R0609 Other forms of dyspnea: Secondary | ICD-10-CM | POA: Diagnosis not present

## 2023-03-17 DIAGNOSIS — G459 Transient cerebral ischemic attack, unspecified: Secondary | ICD-10-CM | POA: Diagnosis present

## 2023-03-17 LAB — NM PET CT CARDIAC PERFUSION MULTI W/ABSOLUTE BLOODFLOW
MBFR: 2.64
Nuc Rest EF: 50 %
Nuc Stress EF: 49 %
Peak HR: 73 {beats}/min
Rest HR: 50 {beats}/min
Rest MBF: 0.53 ml/g/min
Rest Nuclear Isotope Dose: 25 mCi
SRS: 0
SSS: 0
ST Depression (mm): 0 mm
Stress MBF: 1.4 ml/g/min
Stress Nuclear Isotope Dose: 25 mCi
TID: 1.2

## 2023-03-17 MED ORDER — RUBIDIUM RB82 GENERATOR (RUBYFILL)
25.0000 | PACK | Freq: Once | INTRAVENOUS | Status: AC
  Administered 2023-03-17: 25.01 via INTRAVENOUS

## 2023-03-17 MED ORDER — RUBIDIUM RB82 GENERATOR (RUBYFILL)
25.0000 | PACK | Freq: Once | INTRAVENOUS | Status: AC
  Administered 2023-03-17: 25.03 via INTRAVENOUS

## 2023-03-17 MED ORDER — REGADENOSON 0.4 MG/5ML IV SOLN
0.4000 mg | Freq: Once | INTRAVENOUS | Status: AC
  Administered 2023-03-17: 0.4 mg via INTRAVENOUS
  Filled 2023-03-17: qty 5

## 2023-03-17 MED ORDER — REGADENOSON 0.4 MG/5ML IV SOLN
INTRAVENOUS | Status: AC
  Filled 2023-03-17: qty 5

## 2023-03-17 NOTE — Progress Notes (Signed)
Patient presents for a cardiac PET stress test and tolerated procedure without incident. Patient maintained acceptable vital signs throughout the test and was offered caffeine after test.  Patient ambulated out of department with a steady gait.  

## 2023-04-04 ENCOUNTER — Encounter: Payer: Self-pay | Admitting: Internal Medicine

## 2023-04-05 NOTE — Telephone Encounter (Signed)
Spoke to patient and his wife about cardioversion  After explaining again he would like to proceed Would go ahead an schedule    Confirm no missed doses    Do after 1 month of Xarelto

## 2023-04-05 NOTE — Telephone Encounter (Signed)
I spoke with the pt and he prefers to wait it have his Cardioversion the week of 1/6.Marland Kitchen prefers Tues, Wed Thurs.... he will be sure not to have any missed doses of Xarelto but if he does he will let us know.   Will call to sched and will call the pt later this week. He prefers to have at [facility].

## 2023-04-07 ENCOUNTER — Encounter: Payer: Self-pay | Admitting: Internal Medicine

## 2023-04-07 ENCOUNTER — Other Ambulatory Visit: Payer: Self-pay

## 2023-04-07 DIAGNOSIS — Z01810 Encounter for preprocedural cardiovascular examination: Secondary | ICD-10-CM

## 2023-04-07 NOTE — Telephone Encounter (Signed)
Pt scheduled for his cardioversion on 05/03/23 and verbalized understanding of his instructions.

## 2023-05-02 ENCOUNTER — Encounter: Payer: Self-pay | Admitting: Internal Medicine

## 2023-05-02 NOTE — Anesthesia Preprocedure Evaluation (Addendum)
 Anesthesia Evaluation  Patient identified by MRN, date of birth, ID band Patient awake    Reviewed: Allergy & Precautions, NPO status , Patient's Chart, lab work & pertinent test results  Airway Mallampati: II  TM Distance: >3 FB Neck ROM: Full    Dental no notable dental hx. (+) Teeth Intact, Dental Advisory Given   Pulmonary Current Smoker, former smoker   Pulmonary exam normal breath sounds clear to auscultation       Cardiovascular hypertension, Pt. on home beta blockers and Pt. on medications Normal cardiovascular exam+ dysrhythmias Atrial Fibrillation + Valvular Problems/Murmurs (mild/mod MR) MR  Rhythm:Irregular Rate:Normal  TTE 2024  1. Left ventricular ejection fraction, by estimation, is 50 to 55%. The  left ventricle has low normal function. The left ventricle has no regional  wall motion abnormalities. Left ventricular diastolic parameters are  indeterminate.   2. Right ventricular systolic function is low normal. The right  ventricular size is normal. There is mildly elevated pulmonary artery  systolic pressure. The estimated right ventricular systolic pressure is  38.5 mmHg.   3. Left atrial size was mildly dilated.   4. Right atrial size was mildly dilated.   5. The mitral valve is normal in structure. Mild to moderate mitral valve  regurgitation. No evidence of mitral stenosis.   6. The aortic valve is tricuspid. There is mild calcification of the  aortic valve. Aortic valve regurgitation is not visualized. No aortic  stenosis is present.   7. Aortic dilatation noted. There is mild dilatation of the ascending  aorta, measuring 38 mm.   8. The inferior vena cava is normal in size with <50% respiratory  variability, suggesting right atrial pressure of 8 mmHg.   9. The patient was in atrial fibrillation.      Neuro/Psych TIA negative psych ROS   GI/Hepatic negative GI ROS, Neg liver ROS,,,  Endo/Other   negative endocrine ROS    Renal/GU negative Renal ROS  negative genitourinary   Musculoskeletal negative musculoskeletal ROS (+)    Abdominal   Peds  Hematology negative hematology ROS (+)   Anesthesia Other Findings   Reproductive/Obstetrics                             Anesthesia Physical Anesthesia Plan  ASA: 3  Anesthesia Plan: General   Post-op Pain Management:    Induction: Intravenous  PONV Risk Score and Plan: Propofol  infusion and Treatment may vary due to age or medical condition  Airway Management Planned: Natural Airway  Additional Equipment:   Intra-op Plan:   Post-operative Plan:   Informed Consent: I have reviewed the patients History and Physical, chart, labs and discussed the procedure including the risks, benefits and alternatives for the proposed anesthesia with the patient or authorized representative who has indicated his/her understanding and acceptance.     Dental advisory given  Plan Discussed with: CRNA  Anesthesia Plan Comments:        Anesthesia Quick Evaluation

## 2023-05-02 NOTE — OR Nursing (Signed)
 Called patient with pre-procedure instructions for tomorrow.   Patient informed of:   Time to arrive for procedure.0730 Remain NPO past midnight.  Must have a ride home and a responsible adult to remain with them for 24 hours post procedure.  Confirmed blood thinner. Xarelto . Confirmed no breaks in taking blood thinner for 3+ weeks prior to procedure. Confirmed patient stopped all GLP-1s and GLP-2s for at least one week before procedure.   Left message for patient regarding above information. Instructed to call back with any questions.

## 2023-05-03 ENCOUNTER — Encounter (HOSPITAL_COMMUNITY)
Admission: RE | Disposition: A | Discharge status: Home / Self Care | Payer: Self-pay | Source: Home / Self Care | Attending: Cardiovascular Disease

## 2023-05-03 ENCOUNTER — Other Ambulatory Visit: Payer: Self-pay

## 2023-05-03 ENCOUNTER — Encounter (HOSPITAL_COMMUNITY): Payer: Self-pay | Admitting: Cardiovascular Disease

## 2023-05-03 ENCOUNTER — Ambulatory Visit (HOSPITAL_BASED_OUTPATIENT_CLINIC_OR_DEPARTMENT_OTHER): Payer: Medicare HMO | Admitting: Anesthesiology

## 2023-05-03 ENCOUNTER — Ambulatory Visit (HOSPITAL_COMMUNITY)
Admission: RE | Admit: 2023-05-03 | Discharge: 2023-05-03 | Disposition: A | Discharge status: Home / Self Care | Payer: Medicare HMO | Attending: Cardiovascular Disease | Admitting: Cardiovascular Disease

## 2023-05-03 ENCOUNTER — Ambulatory Visit (HOSPITAL_COMMUNITY): Payer: Self-pay | Admitting: Anesthesiology

## 2023-05-03 DIAGNOSIS — R0602 Shortness of breath: Secondary | ICD-10-CM | POA: Diagnosis not present

## 2023-05-03 DIAGNOSIS — I4891 Unspecified atrial fibrillation: Secondary | ICD-10-CM

## 2023-05-03 DIAGNOSIS — F17210 Nicotine dependence, cigarettes, uncomplicated: Secondary | ICD-10-CM | POA: Diagnosis not present

## 2023-05-03 DIAGNOSIS — Z87891 Personal history of nicotine dependence: Secondary | ICD-10-CM | POA: Diagnosis not present

## 2023-05-03 DIAGNOSIS — I10 Essential (primary) hypertension: Secondary | ICD-10-CM | POA: Insufficient documentation

## 2023-05-03 DIAGNOSIS — G459 Transient cerebral ischemic attack, unspecified: Secondary | ICD-10-CM

## 2023-05-03 DIAGNOSIS — I2510 Atherosclerotic heart disease of native coronary artery without angina pectoris: Secondary | ICD-10-CM | POA: Insufficient documentation

## 2023-05-03 DIAGNOSIS — Z8673 Personal history of transient ischemic attack (TIA), and cerebral infarction without residual deficits: Secondary | ICD-10-CM | POA: Diagnosis not present

## 2023-05-03 DIAGNOSIS — I482 Chronic atrial fibrillation, unspecified: Secondary | ICD-10-CM | POA: Diagnosis present

## 2023-05-03 DIAGNOSIS — Z7901 Long term (current) use of anticoagulants: Secondary | ICD-10-CM | POA: Diagnosis not present

## 2023-05-03 DIAGNOSIS — E785 Hyperlipidemia, unspecified: Secondary | ICD-10-CM | POA: Insufficient documentation

## 2023-05-03 HISTORY — PX: CARDIOVERSION: EP1203

## 2023-05-03 LAB — CBC
Hematocrit: 47 % (ref 37.5–51.0)
Hemoglobin: 15.5 g/dL (ref 13.0–17.7)
MCH: 31.5 pg (ref 26.6–33.0)
MCHC: 33 g/dL (ref 31.5–35.7)
MCV: 96 fL (ref 79–97)
Platelets: 393 10*3/uL (ref 150–450)
RBC: 4.92 x10E6/uL (ref 4.14–5.80)
RDW: 12 % (ref 11.6–15.4)
WBC: 13.3 10*3/uL — ABNORMAL HIGH (ref 3.4–10.8)

## 2023-05-03 LAB — BASIC METABOLIC PANEL
BUN/Creatinine Ratio: 18 (ref 10–24)
BUN: 17 mg/dL (ref 8–27)
CO2: 24 mmol/L (ref 20–29)
Calcium: 9.4 mg/dL (ref 8.6–10.2)
Chloride: 100 mmol/L (ref 96–106)
Creatinine, Ser: 0.94 mg/dL (ref 0.76–1.27)
Glucose: 100 mg/dL — ABNORMAL HIGH (ref 70–99)
Potassium: 4.9 mmol/L (ref 3.5–5.2)
Sodium: 137 mmol/L (ref 134–144)
eGFR: 89 mL/min/{1.73_m2} (ref >59)

## 2023-05-03 SURGERY — CARDIOVERSION (CATH LAB)
Anesthesia: General

## 2023-05-03 MED ORDER — PROPOFOL 10 MG/ML IV BOLUS
INTRAVENOUS | Status: DC | PRN
  Administered 2023-05-03: 40 mg via INTRAVENOUS
  Administered 2023-05-03: 30 mg via INTRAVENOUS
  Administered 2023-05-03: 20 mg via INTRAVENOUS
  Administered 2023-05-03: 50 mg via INTRAVENOUS

## 2023-05-03 SURGICAL SUPPLY — 1 items: PAD DEFIB RADIO PHYSIO CONN (PAD) ×1 IMPLANT

## 2023-05-03 NOTE — H&P (Signed)
 Expand All Collapse All      Cardiology Office Note     Date:  03/08/2023    ID:  Greg Poole, DOB 12/14/1955, MRN [id number]   PCP:  Greg Juanita SQUIBB, MD     Cardiologist:   Greg Gull, MD    Pt referred for eval of CAD as seen on CT scan    History of Present Illness: Greg Poole is a 68 y.o. male with a history of HTN, TIA and HL About 6 months ago had SOB   At that time was 250 lbs  Smoking cigarettes    He has quit, lost some weight  He says SOB has improved some, though does still get SOB with activity  Not as bad   Denies palpitations   No CP   He does  little walking   Cuts grss on riding mower   He had a calcium  score CT in AUg   Score 1666 (96%ile for age/sex)     Active Medications      Current Meds  Medication Sig   amLODipine  (NORVASC ) 5 MG tablet Take 5 mg by mouth daily.   atorvastatin  (LIPITOR) 40 MG tablet Take 1 tablet (40 mg total) by mouth daily at 6 PM.   bisoprolol-hydrochlorothiazide (ZIAC) 5-6.25 MG tablet Take 1 tablet by mouth daily.   clopidogrel  (PLAVIX ) 75 MG tablet Take 75 mg by mouth daily.   gabapentin  (NEURONTIN ) 300 MG capsule Take 300 mg by mouth 3 (three) times daily.   pregabalin  (LYRICA ) 100 MG capsule Take 100 mg by mouth 3 (three) times daily.   vitamin C  (ASCORBIC ACID ) 500 MG tablet Take 500 mg by mouth daily.   [DISCONTINUED] benzonatate  (TESSALON ) 100 MG capsule Take 1 capsule (100 mg total) by mouth every 8 (eight) hours.   [DISCONTINUED] fluticasone  (FLONASE ) 50 MCG/ACT nasal spray Place 2 sprays into both nostrils daily.   [DISCONTINUED] ipratropium (ATROVENT ) 0.06 % nasal spray Place 2 sprays into both nostrils 4 (four) times daily.          Allergies:   Patient has no known allergies.        Past Medical History:  Diagnosis Date   Degeneration of lumbar intervertebral disc     Hypercholesteremia     Hyperlipidemia     Hypertension     Leukocytosis, unspecified type     Neuropathy     Obesity      Osteoarthritis of knee     Retinal artery occlusion     Tobacco use                 Past Surgical History:  Procedure Laterality Date   BACK SURGERY       COLONOSCOPY WITH PROPOFOL  N/A 04/09/2019    Procedure: COLONOSCOPY WITH PROPOFOL ;  Surgeon: Greg Keene NOVAK, MD;  Location: [facility] ENDOSCOPY;  Service: Endoscopy;  Laterality: N/A;   TEE WITHOUT CARDIOVERSION N/A 03/10/2018    Procedure: TRANSESOPHAGEAL ECHOCARDIOGRAM (TEE);  Surgeon: Greg Deatrice LABOR, MD;  Location: [facility] ORS;  Service: Cardiovascular;  Laterality: N/A;            Social History:  The patient  reports that he has been smoking. He has never used smokeless tobacco. He reports current alcohol use. He reports that he does not use drugs.   Quit  Had smoked about 2 packs per week  All life  Quit summer 2024      Family History:  The patient's family history includes Stroke in his  mother.      ROS:  Please see the history of present illness. All other systems are reviewed and  Negative to the above problem except as noted.      PHYSICAL EXAM: VS:  BP 124/70   Pulse 63   Ht 6' (1.829 m)   Wt 235 lb 9.6 oz (106.9 kg)   SpO2 98%   BMI 31.95 kg/m   GEN: Obese 68 yo  in no acute distress  HEENT: normal  Neck: no JVD, carotid bruits Cardiac: Irreg ; no murmurs  No LE edema  Respiratory:  clear to auscultation  GI: soft, nontender  No hepatomegaly  MS: no deformity Moving all extremities   Neuro:  Strength and sensation are intact Psych: euthymic mood, full affect     EKG:  EKG is ordered today.  Atrial fibrillation 63 bpm Nonspecific ST changese    Calcium  score CT  Aug 2024   Ascending Aorta: Upper normal diameter 3.7 cm Pericardium: Norma Coronary arteries: LM and severe 3 vessel coronary calcium  LM 206 LAD 361 LCX 191 RCA 907 Total 1666   IMPRESSION: Coronary calcium  score of 1666. This was 91 percentile for age and sex matched control.   Diet:   Br:  Skip   Coffee  Black Lunch:  Skip  lunch   Water  Dinner:  Occupational Psychologist rinds        Lipid Panel Labs (Brief)          Component Value Date/Time    CHOL 202 (H) 03/08/2018 0512    TRIG 111 03/08/2018 0512    HDL 48 03/08/2018 0512    CHOLHDL 4.2 03/08/2018 0512    VLDL 22 03/08/2018 0512    LDLCALC 132 (H) 03/08/2018 0512             Wt Readings from Last 3 Encounters:  03/08/23 235 lb 9.6 oz (106.9 kg)  04/09/19 235 lb 7.6 oz (106.8 kg)  03/10/18 99 lb 6.4 oz (45.1 kg)        ASSESSMENT AND PLAN:   1 CAD  Pt with Ca score of over 1666    Denies CP  But does have SOB with activity    PET/CT normal perfusion     2  Atrial fibrillation   Has been on xarelto  > 1 month. Monitor with chronic afib no periods of sinus Proceed with [facility] Risks including stroke discussed willing to proceed    3.  HTN  BP is controlled   Continue meds    4  HL LDL 72  HDL 50  in Dec 2023  Will get lipomed, lpa and Apo B   Current medicines are reviewed at length with the patient today.  The patient does not have concerns regarding medicines.      Greg Emmer MD [facility]

## 2023-05-03 NOTE — Discharge Instructions (Signed)

## 2023-05-03 NOTE — Anesthesia Postprocedure Evaluation (Signed)
 Anesthesia Post Note  Patient: Greg Poole  Procedure(s) Performed: CARDIOVERSION     Patient location during evaluation: Endoscopy Anesthesia Type: General Level of consciousness: awake and alert Pain management: pain level controlled Vital Signs Assessment: post-procedure vital signs reviewed and stable Respiratory status: spontaneous breathing, nonlabored ventilation, respiratory function stable and patient connected to nasal cannula oxygen Cardiovascular status: blood pressure returned to baseline and stable Postop Assessment: no apparent nausea or vomiting Anesthetic complications: no  No notable events documented.  Last Vitals:  Vitals:   05/03/23 0910 05/03/23 0930  BP: 96/70 108/66  Pulse: 62 (!) 54  Resp: 17 15  Temp:    SpO2: 94% 96%    Last Pain:  Vitals:   05/03/23 0906  TempSrc: Temporal  PainSc: 0-No pain                 Odilia Damico L Naszir Cott

## 2023-05-03 NOTE — Interval H&P Note (Signed)
 History and Physical Interval Note:  05/03/2023 7:55 AM  Greg Poole  has presented today for surgery, with the diagnosis of AFIB.  The various methods of treatment have been discussed with the patient and family. After consideration of risks, benefits and other options for treatment, the patient has consented to  Procedure(s): CARDIOVERSION (N/A) as a surgical intervention.  The patient's history has been reviewed, patient examined, no change in status, stable for surgery.  I have reviewed the patient's chart and labs.  Questions were answered to the patient's satisfaction.     [name]

## 2023-05-03 NOTE — Transfer of Care (Signed)
 Immediate Anesthesia Transfer of Care Note  Patient: Greg Poole  Procedure(s) Performed: CARDIOVERSION  Patient Location: PACU  Anesthesia Type:General  Level of Consciousness: awake  Airway & Oxygen Therapy: Patient Spontanous Breathing and Patient connected to nasal cannula oxygen  Post-op Assessment: Report given to RN and Post -op Vital signs reviewed and stable  Post vital signs: Reviewed and stable  Last Vitals:  Vitals Value Taken Time  BP    Temp    Pulse 62 05/03/23 0902  Resp 15 05/03/23 0902  SpO2 97 % 05/03/23 0902  Vitals shown include unfiled device data.  Last Pain:  Vitals:   05/03/23 0758  TempSrc:   PainSc: 0-No pain         Complications: No notable events documented.

## 2023-05-03 NOTE — CV Procedure (Signed)
 DCC: Anesthesia: Propofol  On Rx DOAC no missed doses  DCC x 4 biphasic AP with manual compression on last 3 200,250, 300, 300 Joules  Failed to convert or have a single sinus beat.  F/U Dr Okey consider AAT or EP referral  Rate control is fine in 60-70 bpm range  [name] MD [facility]

## 2023-05-04 ENCOUNTER — Encounter (HOSPITAL_COMMUNITY): Payer: Self-pay | Admitting: Cardiovascular Disease

## 2023-05-05 ENCOUNTER — Telehealth: Payer: Self-pay | Admitting: Internal Medicine

## 2023-05-05 DIAGNOSIS — R899 Unspecified abnormal finding in specimens from other organs, systems and tissues: Secondary | ICD-10-CM

## 2023-05-05 DIAGNOSIS — I4891 Unspecified atrial fibrillation: Secondary | ICD-10-CM

## 2023-05-05 NOTE — Telephone Encounter (Signed)
 Spoke with patient and discussed lab results.  Per Dr. Okey: Electrolytes and kidney function are OK CBC   Hgb is normal    Whiite cell count is mildly increased  May be nonspecific Forward to Dr Ransom Follow up CBC in 1 month  Patient states he and his wife were sick over Christmas with sinus congestion, sore throat and hacking cough. He continues to have lingering cough that is slowly improving, no other symptoms. He believes this may be the reason his WBC is elevated.  CBC ordered. Patient verbalized understanding of the above and having repeat CBC drawn at any [facility] location in 1 month.  Patient also mentioned his recent cardioversion (05/03/23) was unsuccessful. He states I was hit 4 times and it didn't work. He also states MD performing DCCV mentioned reviewing with Dr. Okey, that she may want to change his medication and try another DCCV in 30-40 days.  Will forward to Dr. Okey to review.

## 2023-05-05 NOTE — Telephone Encounter (Signed)
 Afib may be contributing to SOB with exertion. Failed cardioversion attempts PET /CT stress test was normal  I would refer to EP for possible medicine vs ablation.to get him into SR

## 2023-05-05 NOTE — Telephone Encounter (Signed)
 Patient was returning phone call

## 2023-05-10 NOTE — Telephone Encounter (Signed)
 Pt advised and agrees to seeing EP... referral placed.

## 2023-05-18 ENCOUNTER — Encounter: Payer: Self-pay | Admitting: Internal Medicine

## 2023-06-04 LAB — CBC
Hematocrit: 40.8 % (ref 37.5–51.0)
Hemoglobin: 13.9 g/dL (ref 13.0–17.7)
MCH: 33.1 pg — ABNORMAL HIGH (ref 26.6–33.0)
MCHC: 34.1 g/dL (ref 31.5–35.7)
MCV: 97 fL (ref 79–97)
Platelets: 305 10*3/uL (ref 150–450)
RBC: 4.2 x10E6/uL (ref 4.14–5.80)
RDW: 13 % (ref 11.6–15.4)
WBC: 11.3 10*3/uL — ABNORMAL HIGH (ref 3.4–10.8)

## 2023-06-07 ENCOUNTER — Encounter: Payer: Self-pay | Admitting: Cardiovascular Disease

## 2023-06-16 NOTE — Progress Notes (Unsigned)
  Electrophysiology Office Note:    Date:  06/17/2023   ID:  Greg Poole, DOB 04-11-1956, MRN [id number]  [facility] Cardiologist:  None  [facility] Electrophysiologist:  Lanier Prude, MD   Referring MD: Pricilla Riffle, MD   Chief Complaint: Atrial fibrillation  History of Present Illness:    Greg Poole is a 68 year old man who I am seeing today for an evaluation of atrial fibrillation at the request of Dr. Tenny Craw.  The patient has a history of hypertension, TIA, hyperlipidemia and atrial fibrillation.  He also has coronary artery disease.  He is on Xarelto for stroke prophylaxis.  The patient reports shortness of breath with exertion.  He is being referred to EP to discuss catheter ablation versus antiarrhythmic drug therapy.  He feels short of breath while in atrial fibrillation.  He thinks he may have been out of rhythm for quite some time.  He would have had an EKG around the time of a colonoscopy 5 years ago.    Their past medical, social and family history was reviewed.   ROS:   Please see the history of present illness.    All other systems reviewed and are negative.  EKGs/Labs/Other Studies Reviewed:    The following studies were reviewed today:  March 23, 2023 ZIO monitor personally reviewed 100% atrial fibrillation, average heart rate 63 bpm Occasional PVCs   March 10, 2023 echo EF 50 to 55% RV low normal Mildly dilated left and right atrium Mild to moderate MR  March 08, 2023 EKG shows atrial fibrillation, QTc 440 ms   May 02, 2023 blood work shows a creatinine of 0.94      Physical Exam:    VS:  BP 112/72   Pulse (!) 53   Ht 6' (1.829 m)   Wt 234 lb 3.2 oz (106.2 kg)   SpO2 99%   BMI 31.76 kg/m     Wt Readings from Last 3 Encounters:  06/17/23 234 lb 3.2 oz (106.2 kg)  05/03/23 225 lb (102.1 kg)  03/08/23 235 lb 9.6 oz (106.9 kg)     GEN: no distress CARD: Irregularly irregular, No MRG RESP: No IWOB.  CTAB.        ASSESSMENT AND PLAN:    No diagnosis found.  #Atrial fibrillation Failed to convert during May 03, 2023 cardioversion.  Symptomatic with shortness of breath on exertion.  I discussed treatment options with the patient including antiarrhythmic drugs and catheter ablation.  I would like to start by making sure he will achieve sinus rhythm.  I discussed antiarrhythmic drug options today including amiodarone and Tikosyn.  Given his young age, I do not think amiodarone is the best option.  He is okay with starting Tikosyn.  I reviewed the Tikosyn loading process in detail the patient including the risks and he wished to proceed.  He will stop his bisoprolol hydrochlorothiazide today.  He confirms no missed doses of Xarelto recently.   If he achieves sinus rhythm after loading with Tikosyn, favor catheter ablation for more durable rhythm control strategy.   #Hypertension At goal today.  Recommend checking blood pressures 1-2 times per week at home and recording the values.  Recommend bringing these recordings to the primary care physician. Stop Ziac today   Signed, Sheria Lang T. Lalla Brothers, MD, [facility], [facility] 06/17/2023 2:33 PM    Electrophysiology [facility]

## 2023-06-17 ENCOUNTER — Encounter: Payer: Self-pay | Admitting: Cardiology

## 2023-06-17 ENCOUNTER — Ambulatory Visit: Payer: Medicare HMO | Attending: Cardiology | Admitting: Cardiology

## 2023-06-17 VITALS — BP 112/72 | HR 53 | Ht 72.0 in | Wt 234.2 lb

## 2023-06-17 DIAGNOSIS — I4819 Other persistent atrial fibrillation: Secondary | ICD-10-CM

## 2023-06-17 DIAGNOSIS — I10 Essential (primary) hypertension: Secondary | ICD-10-CM

## 2023-06-17 NOTE — Patient Instructions (Signed)
Medication Instructions:  Your physician has recommended you make the following change in your medication:  1) STOP taking bisoprolol-hydrochlorothiazide  *If you need a refill on your cardiac medications before your next appointment, please call your pharmacy*   Follow-Up: At [facility], you and your health needs are our priority.  As part of our continuing mission to provide you with exceptional heart care, we have created designated Provider Care Teams.  These Care Teams include your primary Cardiologist (physician) and Advanced Practice Providers (APPs -  Physician Assistants and Nurse Practitioners) who all work together to provide you with the care you need, when you need it.  Tikosyn (Dofetilide) Hospital Admission   Prior to day of admission:  Check with drug insurance company for cost of drug to ensure affordability --- Dofetilide 500 mcg twice a day.  GoodRx is an option if insurance copay is unaffordable.    No Benadryl is allowed 3 days prior to admission.   Please ensure no missed doses of your anticoagulation (blood thinner) for 3 weeks prior to admission. If a dose is missed please notify our office immediately.   A pharmacist will review all your medications for potential interactions with Tikosyn. If any medication changes are needed prior to admission we will be in touch with you.   If any new medications are started AFTER your admission date is set with Radio producer. Please notify our office immediately so your medication list can be updated and reviewed by our pharmacist again.  On day of admission:  Tikosyn initiation requires a 3 night/4 day hospital stay with constant telemetry monitoring. You will have an EKG after each dose of Tikosyn as well as daily lab draws.   If the drug does not convert you to normal rhythm a cardioversion after the 4th dose of Tikosyn.   Afib Clinic office visit on the morning of admission is needed for preliminary labs/ekg.   Time  of admission is dependent on bed availability in the hospital. In some instances, you will be sent home until bed is available. Rarely admission can be delayed to the following day if hospital census prevents available beds.   You may bring personal belongings/clothing with you to the hospital. Please leave your suitcase in the car until you arrive in admissions.   Questions please call our office at [phone]

## 2023-06-21 ENCOUNTER — Telehealth: Payer: Self-pay | Admitting: Pharmacist

## 2023-06-21 NOTE — Telephone Encounter (Signed)
 Medication list reviewed in anticipation of upcoming Tikosyn initiation. Patient is not taking any contraindicated or QTc prolonging medications.   Patient is anticoagulated on Xarelto on the appropriate dose. Please ensure that patient has not missed any anticoagulation doses in the 3 weeks prior to Tikosyn initiation.   Patient will need to be counseled to avoid use of Benadryl while on Tikosyn and in the 2-3 days prior to Tikosyn initiation.

## 2023-06-22 ENCOUNTER — Encounter (HOSPITAL_COMMUNITY): Payer: Self-pay

## 2023-07-01 ENCOUNTER — Encounter (HOSPITAL_COMMUNITY): Payer: Self-pay

## 2023-07-05 ENCOUNTER — Other Ambulatory Visit: Payer: Self-pay

## 2023-07-05 ENCOUNTER — Ambulatory Visit (HOSPITAL_COMMUNITY)
Admission: RE | Admit: 2023-07-05 | Discharge: 2023-07-05 | Disposition: A | Discharge status: Home / Self Care | Payer: Medicare HMO | Source: Ambulatory Visit | Attending: Physician Assistant | Admitting: Physician Assistant

## 2023-07-05 ENCOUNTER — Encounter (HOSPITAL_COMMUNITY): Payer: Self-pay | Admitting: Physician Assistant

## 2023-07-05 ENCOUNTER — Inpatient Hospital Stay (HOSPITAL_COMMUNITY)
Admission: AD | Admit: 2023-07-05 | Discharge: 2023-07-08 | DRG: 309 | Disposition: A | Discharge status: Home / Self Care | Source: Ambulatory Visit | Attending: Cardiology | Admitting: Cardiology

## 2023-07-05 ENCOUNTER — Encounter (HOSPITAL_COMMUNITY): Payer: Self-pay | Admitting: Cardiology

## 2023-07-05 VITALS — BP 132/92 | HR 64 | Ht 72.0 in | Wt 232.0 lb

## 2023-07-05 DIAGNOSIS — Z6831 Body mass index (BMI) 31.0-31.9, adult: Secondary | ICD-10-CM

## 2023-07-05 DIAGNOSIS — D6869 Other thrombophilia: Secondary | ICD-10-CM

## 2023-07-05 DIAGNOSIS — Z87891 Personal history of nicotine dependence: Secondary | ICD-10-CM | POA: Diagnosis not present

## 2023-07-05 DIAGNOSIS — E7800 Pure hypercholesterolemia, unspecified: Secondary | ICD-10-CM | POA: Diagnosis present

## 2023-07-05 DIAGNOSIS — I4819 Other persistent atrial fibrillation: Principal | ICD-10-CM

## 2023-07-05 DIAGNOSIS — Z79899 Other long term (current) drug therapy: Secondary | ICD-10-CM

## 2023-07-05 DIAGNOSIS — G629 Polyneuropathy, unspecified: Secondary | ICD-10-CM | POA: Diagnosis present

## 2023-07-05 DIAGNOSIS — Z7901 Long term (current) use of anticoagulants: Secondary | ICD-10-CM | POA: Insufficient documentation

## 2023-07-05 DIAGNOSIS — I10 Essential (primary) hypertension: Secondary | ICD-10-CM | POA: Diagnosis not present

## 2023-07-05 DIAGNOSIS — I2510 Atherosclerotic heart disease of native coronary artery without angina pectoris: Secondary | ICD-10-CM | POA: Diagnosis not present

## 2023-07-05 DIAGNOSIS — Z8673 Personal history of transient ischemic attack (TIA), and cerebral infarction without residual deficits: Secondary | ICD-10-CM

## 2023-07-05 DIAGNOSIS — Z888 Allergy status to other drugs, medicaments and biological substances status: Secondary | ICD-10-CM | POA: Diagnosis not present

## 2023-07-05 DIAGNOSIS — I4891 Unspecified atrial fibrillation: Secondary | ICD-10-CM | POA: Diagnosis not present

## 2023-07-05 DIAGNOSIS — E669 Obesity, unspecified: Secondary | ICD-10-CM | POA: Diagnosis present

## 2023-07-05 LAB — BASIC METABOLIC PANEL
Anion gap: 8 (ref 5–15)
BUN: 16 mg/dL (ref 8–23)
CO2: 20 mmol/L — ABNORMAL LOW (ref 22–32)
Calcium: 8.7 mg/dL — ABNORMAL LOW (ref 8.9–10.3)
Chloride: 107 mmol/L (ref 98–111)
Creatinine, Ser: 1.02 mg/dL (ref 0.61–1.24)
GFR, Estimated: >60 mL/min (ref >60)
Glucose, Bld: 105 mg/dL — ABNORMAL HIGH (ref 70–99)
Potassium: 4.1 mmol/L (ref 3.5–5.1)
Sodium: 135 mmol/L (ref 135–145)

## 2023-07-05 LAB — MAGNESIUM: Magnesium: 2.3 mg/dL (ref 1.7–2.4)

## 2023-07-05 MED ORDER — SODIUM CHLORIDE 0.9% FLUSH: 3.0000 mL | INTRAVENOUS | Status: DC | PRN

## 2023-07-05 MED ORDER — DOFETILIDE 500 MCG PO CAPS
500.0000 ug | ORAL_CAPSULE | Freq: Two times a day (BID) | ORAL | Status: DC
  Administered 2023-07-05 – 2023-07-08 (×6): 500 ug via ORAL
  Filled 2023-07-05 (×6): qty 1

## 2023-07-05 MED ORDER — SODIUM CHLORIDE 0.9 % IV SOLN: 250.0000 mL | INTRAVENOUS | Status: AC | PRN

## 2023-07-05 MED ORDER — ATORVASTATIN CALCIUM 80 MG PO TABS
80.0000 mg | ORAL_TABLET | Freq: Every day | ORAL | Status: DC
  Administered 2023-07-06 – 2023-07-08 (×3): 80 mg via ORAL
  Filled 2023-07-05 (×3): qty 1

## 2023-07-05 MED ORDER — SODIUM CHLORIDE 0.9% FLUSH
3.0000 mL | Freq: Two times a day (BID) | INTRAVENOUS | Status: DC
  Administered 2023-07-06 – 2023-07-08 (×3): 3 mL via INTRAVENOUS

## 2023-07-05 MED ORDER — RIVAROXABAN 20 MG PO TABS
20.0000 mg | ORAL_TABLET | Freq: Every day | ORAL | Status: DC
  Administered 2023-07-05 – 2023-07-07 (×3): 20 mg via ORAL
  Filled 2023-07-05 (×3): qty 1

## 2023-07-05 MED ORDER — VITAMIN C 500 MG PO TABS
1000.0000 mg | ORAL_TABLET | Freq: Every day | ORAL | Status: DC
  Administered 2023-07-06 – 2023-07-08 (×3): 1000 mg via ORAL
  Filled 2023-07-05 (×3): qty 2

## 2023-07-05 MED ORDER — OXYMETAZOLINE HCL 0.05 % NA SOLN: 1.0000 | Freq: Two times a day (BID) | NASAL | Status: DC | PRN

## 2023-07-05 MED ORDER — GABAPENTIN 100 MG PO CAPS
100.0000 mg | ORAL_CAPSULE | Freq: Three times a day (TID) | ORAL | Status: DC
  Administered 2023-07-05 – 2023-07-08 (×9): 100 mg via ORAL
  Filled 2023-07-05 (×9): qty 1

## 2023-07-05 MED ORDER — AMLODIPINE BESYLATE 5 MG PO TABS
5.0000 mg | ORAL_TABLET | Freq: Every day | ORAL | Status: DC
  Administered 2023-07-06 – 2023-07-08 (×3): 5 mg via ORAL
  Filled 2023-07-05 (×3): qty 1

## 2023-07-05 NOTE — Progress Notes (Signed)
 Pharmacy: Dofetilide (Tikosyn) - Initial Consult Assessment and Electrolyte Replacement  Pharmacy consulted to assist in monitoring and replacing electrolytes in this 68 y.o. male admitted on 07/05/2023 undergoing dofetilide initiation. First dofetilide dose: 3/11 2000  Assessment:  Patient Exclusion Criteria: If any screening criteria checked as "Yes", then  patient  should NOT receive dofetilide until criteria item is corrected.  If "Yes" please indicate correction plan.  YES  NO Patient  Exclusion Criteria Correction Plan/Comments   []   [x]   Baseline QTc interval is greater than or equal to 440 msec. IF above YES box checked dofetilide contraindicated unless patient has ICD; then may proceed if QTc 500-550 msec or with known ventricular conduction abnormalities may proceed with QTc 550-600 msec. QTc =      []   [x]   Patient is known or suspected to have a digoxin level greater than 2 ng/ml: No results found for: "DIGOXIN"     []   [x]   Creatinine clearance less than 20 ml/min (calculated using Cockcroft-Gault, actual body weight and serum creatinine): Estimated Creatinine Clearance: 89.3 mL/min (by C-G formula based on SCr of 1.02 mg/dL).     []   [x]  Patient has received drugs known to prolong the QT intervals within the last 48 hour (examples: phenothiazines, tricyclics or tetracyclic antidepressants, macrolides, 1st generation H-1 antihistamines (especially diphenhydramine), fluoroquinolones, azoles, ondansetron, metoclopramide, promethazine).   Updated information on QT prolonging agents is available to be searched on the following database:QT prolonging agents -If SSRI or antihistamine needed, preferred options are sertraline and loratadine respectively     []   [x]  Patient received a dose of a thiazide diuretic in the last 48 hours [including hydrochlorothiazide (Oretic) alone or in any combination including triamterene (Dyazide, Maxzide)].    []   [x]  Patient received a  medication known to increase dofetilide plasma concentrations prior to initial dofetilide dose:  Trimethoprim (Primsol, Proloprim) in the last 36 hours Verapamil (Calan, Verelan) in the last 36 hours or a sustained release dose in the last 72 hours Megestrol (Megace) in the last 5 days  Cimetidine (Tagamet) in the last 6 hours Ketoconazole (Nizoral) in the last 24 hours Itraconazole (Sporanox) in the last 48 hours  Prochlorperazine (Compazine) in the last 36 hours     []   [x]   Patient is known to have a history of torsades de pointes; congenital or acquired long QT syndromes.    []   [x]   Patient has received a Class 1 and Class 3 antiarrhythmic with less than 2 half-lives since last dose. (Disopyramide, Quinidine, Procainamide, Lidocaine, Mexiletine, Flecainide, Propafenone, Sotalol, Dronedarone)    []   [x]   Patient has received amiodarone therapy in the past 3 months or amiodarone level is greater than 0.3 ng/ml.    Labs:    Component Value Date/Time   K 4.1 07/05/2023 1102   MG 2.3 07/05/2023 1102     Plan: Select One Calculated CrCl  Dose q12h  [x]  > 60 ml/min 500 mcg  []  40-60 ml/min 250 mcg  []  20-40 ml/min 125 mcg   [x]   Physician selected initial dose within range recommended for patients level of renal function - will monitor for response.  []   Physician selected initial dose outside of range recommended for patients level of renal function - will discuss if the dose should be altered at this time.   Patient has been appropriately anticoagulated with Xarelto.  Potassium: K >/= 4: Appropriate to initiate Tikosyn, no replacement needed    Magnesium: Mg >2: Appropriate to initiate  Tikosyn, no replacement needed     Thank you for allowing pharmacy to participate in this patient's care   Christoper Fabian, PharmD, BCPS Please see amion for complete clinical pharmacist phone list 07/05/2023  4:29 PM

## 2023-07-05 NOTE — H&P (Addendum)
 Primary Care Physician: Georgann Housekeeper, MD Primary Cardiologist: Dietrich Pates, MD Electrophysiologist: Lanier Prude, MD  Referring Physician: Dr Fatima Blank is a 68 y.o. male with a history of HTN, TIA, HLD, CAD, atrial fibrillation who presents for follow up in the [facility].  The patient underwent DCCV on 05/03/23 which was unsuccessful. He was seen by Dr Lalla Brothers who recommended dofetilide admission. Patient is on Xarelto for stroke prevention.   Patient presents today for follow up for atrial fibrillation and dofetilide admission. He remains in afib today. He denies any bleeding issues on anticoagulation, no missed doses in the past 3 weeks.   Today, he denies symptoms of palpitations, chest pain, shortness of breath, orthopnea, PND, lower extremity edema, dizziness, presyncope, syncope, snoring, daytime somnolence, bleeding, or neurologic sequela. The patient is tolerating medications without difficulties and is otherwise without complaint today.    Atrial Fibrillation Risk Factors:  he does not have symptoms or diagnosis of sleep apnea. he does not have a history of rheumatic fever.   Atrial Fibrillation Management history:  Previous antiarrhythmic drugs: none Previous cardioversions: 05/03/23 Previous ablations: none Anticoagulation history: Xarelto   ROS- All systems are reviewed and negative except as per the HPI above.  Past Medical History:  Diagnosis Date   Degeneration of lumbar intervertebral disc    Hypercholesteremia    Hyperlipidemia    Hypertension    Leukocytosis, unspecified type    Neuropathy    Obesity    Osteoarthritis of knee    Retinal artery occlusion    Tobacco use     Current Facility-Administered Medications  Medication Dose Route Frequency Provider Last Rate Last Admin   0.9 %  sodium chloride infusion  250 mL Intravenous PRN Lanier Prude, MD       [START ON 07/06/2023] amLODipine (NORVASC)  tablet 5 mg  5 mg Oral Daily Fenton, Clint R, PA       [START ON 07/06/2023] ascorbic acid (VITAMIN C) tablet 1,000 mg  1,000 mg Oral Daily Fenton, Clint R, PA       [START ON 07/06/2023] atorvastatin (LIPITOR) tablet 80 mg  80 mg Oral Daily Fenton, Clint R, PA       dofetilide (TIKOSYN) capsule 500 mcg  500 mcg Oral BID Lanier Prude, MD       gabapentin (NEURONTIN) capsule 100 mg  100 mg Oral TID Robet Leu R, PA-C   100 mg at 07/05/23 1749   oxymetazoline (AFRIN) 0.05 % nasal spray 1 spray  1 spray Each Nare BID PRN Fenton, Clint R, PA       rivaroxaban (XARELTO) tablet 20 mg  20 mg Oral Q supper Steffanie Dunn T, MD   20 mg at 07/05/23 1749   sodium chloride flush (NS) 0.9 % injection 3 mL  3 mL Intravenous Q12H Steffanie Dunn T, MD       sodium chloride flush (NS) 0.9 % injection 3 mL  3 mL Intravenous PRN Lanier Prude, MD        Physical Exam: BP 123/70 (BP Location: Right Arm)   Pulse 67   Temp 97.7 F (36.5 C) (Oral)   Resp 16   Ht 6' (1.829 m)   Wt 106.1 kg   SpO2 99%   BMI 31.74 kg/m   GEN: Well nourished, well developed in no acute distress CARDIAC: Irregularly irregular rate and rhythm, no murmurs, rubs, gallops RESPIRATORY:  Clear to auscultation without  rales, wheezing or rhonchi  ABDOMEN: Soft, non-tender, non-distended EXTREMITIES:  No edema; No deformity   Wt Readings from Last 3 Encounters:  07/05/23 106.1 kg  07/05/23 105.2 kg  06/17/23 106.2 kg     EKG today demonstrates  Afib Vent. rate 64 BPM PR interval * ms QRS duration 86 ms QT/QTcB 412/425 ms   Echo 03/10/23 demonstrated   1. Left ventricular ejection fraction, by estimation, is 50 to 55%. The  left ventricle has low normal function. The left ventricle has no regional  wall motion abnormalities. Left ventricular diastolic parameters are  indeterminate.   2. Right ventricular systolic function is low normal. The right  ventricular size is normal. There is mildly elevated  pulmonary artery  systolic pressure. The estimated right ventricular systolic pressure is  38.5 mmHg.   3. Left atrial size was mildly dilated.   4. Right atrial size was mildly dilated.   5. The mitral valve is normal in structure. Mild to moderate mitral valve  regurgitation. No evidence of mitral stenosis.   6. The aortic valve is tricuspid. There is mild calcification of the  aortic valve. Aortic valve regurgitation is not visualized. No aortic  stenosis is present.   7. Aortic dilatation noted. There is mild dilatation of the ascending  aorta, measuring 38 mm.   8. The inferior vena cava is normal in size with <50% respiratory  variability, suggesting right atrial pressure of 8 mmHg.   9. The patient was in atrial fibrillation.     CHA2DS2-VASc Score = 5  The patient's score is based upon: CHF History: 0 HTN History: 1 Diabetes History: 0 Stroke History: 2 (TIA) Vascular Disease History: 1 Age Score: 1 Gender Score: 0       ASSESSMENT AND PLAN: Persistent Atrial Fibrillation (ICD10:  I48.19) The patient's CHA2DS2-VASc score is 5, indicating a 7.2% annual risk of stroke.   Patient presents for dofetilide admission Continue Xarelto 20 mg daily, states no missed doses in the last 3 weeks. No recent benadryl use PharmD has screened medications for QT prolonging agents.  Labs today show creatinine at 1.02, K+ 4.1 and mag 2.3, CrCl calculated at 106 mL/min  Secondary Hypercoagulable State (ICD10:  D68.69) The patient is at significant risk for stroke/thromboembolism based upon his CHA2DS2-VASc Score of 5.  Continue Rivaroxaban (Xarelto). No bleeding issues.   CAD CAC score 1666 on CT PET stress test 02/2023 low risk for ischemia No anginal symptoms Followed by Dr Tenny Craw  HTN Stable on current regimen Patient seen and examined, note reviewed with the signed Advanced Practice Provider. I personally reviewed laboratory data, imaging studies and relevant notes. I  independently examined the patient and formulated the important aspects of the plan. I have personally discussed the plan with the patient and/or family. Comments or changes to the note/plan are indicated below.  He was seen and examined at his bedside. No complaints.  Here for Dofetilide admission - EKG this morning qtc 425 ms.  No anginal symptoms.  Continue xeralto.   Thomasene Ripple DO, MS [facility] Attending Cardiologist [facility]  [address] [place], [place] [address] [phone] Website: [email or web address]

## 2023-07-05 NOTE — Progress Notes (Signed)
 Primary Care Physician: Georgann Housekeeper, MD Primary Cardiologist: Dietrich Pates, MD Electrophysiologist: Lanier Prude, MD  Referring Physician: Dr Fatima Blank is a 68 y.o. male with a history of HTN, TIA, HLD, CAD, atrial fibrillation who presents for follow up in the [facility].  The patient underwent DCCV on 05/03/23 which was unsuccessful. He was seen by Dr Lalla Brothers who recommended dofetilide admission. Patient is on Xarelto for stroke prevention.   Patient presents today for follow up for atrial fibrillation and dofetilide admission. He remains in afib today. He denies any bleeding issues on anticoagulation, no missed doses in the past 3 weeks.   Today, he denies symptoms of palpitations, chest pain, shortness of breath, orthopnea, PND, lower extremity edema, dizziness, presyncope, syncope, snoring, daytime somnolence, bleeding, or neurologic sequela. The patient is tolerating medications without difficulties and is otherwise without complaint today.    Atrial Fibrillation Risk Factors:  he does not have symptoms or diagnosis of sleep apnea. he does not have a history of rheumatic fever.   Atrial Fibrillation Management history:  Previous antiarrhythmic drugs: none Previous cardioversions: 05/03/23 Previous ablations: none Anticoagulation history: Xarelto   ROS- All systems are reviewed and negative except as per the HPI above.  Past Medical History:  Diagnosis Date   Degeneration of lumbar intervertebral disc    Hypercholesteremia    Hyperlipidemia    Hypertension    Leukocytosis, unspecified type    Neuropathy    Obesity    Osteoarthritis of knee    Retinal artery occlusion    Tobacco use     Current Outpatient Medications  Medication Sig Dispense Refill   amLODipine (NORVASC) 5 MG tablet Take 5 mg by mouth daily.     ascorbic acid (VITAMIN C) 1000 MG tablet Take 1,000 mg by mouth daily.     atorvastatin (LIPITOR) 80 MG  tablet Take 80 mg by mouth daily.     oxymetazoline (AFRIN) 0.05 % nasal spray Place 1 spray into both nostrils 2 (two) times daily as needed for congestion.     pregabalin (LYRICA) 100 MG capsule Take 100 mg by mouth 3 (three) times daily.  4   rivaroxaban (XARELTO) 20 MG TABS tablet Take 1 tablet (20 mg total) by mouth daily. 90 tablet 3   No current facility-administered medications for this encounter.    Physical Exam: BP (!) 132/92   Pulse 64   Ht 6' (1.829 m)   Wt 105.2 kg   BMI 31.46 kg/m   GEN: Well nourished, well developed in no acute distress CARDIAC: Irregularly irregular rate and rhythm, no murmurs, rubs, gallops RESPIRATORY:  Clear to auscultation without rales, wheezing or rhonchi  ABDOMEN: Soft, non-tender, non-distended EXTREMITIES:  No edema; No deformity   Wt Readings from Last 3 Encounters:  07/05/23 105.2 kg  06/17/23 106.2 kg  05/03/23 102.1 kg     EKG today demonstrates  Afib Vent. rate 64 BPM PR interval * ms QRS duration 86 ms QT/QTcB 412/425 ms   Echo 03/10/23 demonstrated   1. Left ventricular ejection fraction, by estimation, is 50 to 55%. The  left ventricle has low normal function. The left ventricle has no regional  wall motion abnormalities. Left ventricular diastolic parameters are  indeterminate.   2. Right ventricular systolic function is low normal. The right  ventricular size is normal. There is mildly elevated pulmonary artery  systolic pressure. The estimated right ventricular systolic pressure is  38.5 mmHg.  3. Left atrial size was mildly dilated.   4. Right atrial size was mildly dilated.   5. The mitral valve is normal in structure. Mild to moderate mitral valve  regurgitation. No evidence of mitral stenosis.   6. The aortic valve is tricuspid. There is mild calcification of the  aortic valve. Aortic valve regurgitation is not visualized. No aortic  stenosis is present.   7. Aortic dilatation noted. There is mild  dilatation of the ascending  aorta, measuring 38 mm.   8. The inferior vena cava is normal in size with <50% respiratory  variability, suggesting right atrial pressure of 8 mmHg.   9. The patient was in atrial fibrillation.     CHA2DS2-VASc Score = 5  The patient's score is based upon: CHF History: 0 HTN History: 1 Diabetes History: 0 Stroke History: 2 (TIA) Vascular Disease History: 1 Age Score: 1 Gender Score: 0       ASSESSMENT AND PLAN: Persistent Atrial Fibrillation (ICD10:  I48.19) The patient's CHA2DS2-VASc score is 5, indicating a 7.2% annual risk of stroke.   Patient presents for dofetilide admission Continue Xarelto 20 mg daily, states no missed doses in the last 3 weeks. No recent benadryl use PharmD has screened medications for QT prolonging agents.  Labs today show creatinine at 1.02, K+ 4.1 and mag 2.3, CrCl calculated at 106 mL/min  Secondary Hypercoagulable State (ICD10:  D68.69) The patient is at significant risk for stroke/thromboembolism based upon his CHA2DS2-VASc Score of 5.  Continue Rivaroxaban (Xarelto). No bleeding issues.   CAD CAC score 1666 on CT PET stress test 02/2023 low risk for ischemia No anginal symptoms Followed by Dr Tenny Craw  HTN Stable on current regimen   To be admitted later today once a bed becomes available.       Jorja Loa PA-C Afib Clinic [facility] [address] [place], [place] [address] [phone]

## 2023-07-05 NOTE — Progress Notes (Signed)
 Patient arrived to room [phone], wife at bedside.

## 2023-07-06 ENCOUNTER — Other Ambulatory Visit (HOSPITAL_COMMUNITY): Payer: Self-pay

## 2023-07-06 ENCOUNTER — Telehealth (HOSPITAL_COMMUNITY): Payer: Self-pay | Admitting: Pharmacy Technician

## 2023-07-06 ENCOUNTER — Other Ambulatory Visit: Payer: Self-pay

## 2023-07-06 DIAGNOSIS — I4819 Other persistent atrial fibrillation: Secondary | ICD-10-CM | POA: Diagnosis not present

## 2023-07-06 LAB — BASIC METABOLIC PANEL
Anion gap: 4 — ABNORMAL LOW (ref 5–15)
BUN: 17 mg/dL (ref 8–23)
CO2: 26 mmol/L (ref 22–32)
Calcium: 8.8 mg/dL — ABNORMAL LOW (ref 8.9–10.3)
Chloride: 106 mmol/L (ref 98–111)
Creatinine, Ser: 0.83 mg/dL (ref 0.61–1.24)
GFR, Estimated: >60 mL/min (ref >60)
Glucose, Bld: 128 mg/dL — ABNORMAL HIGH (ref 70–99)
Potassium: 4.1 mmol/L (ref 3.5–5.1)
Sodium: 136 mmol/L (ref 135–145)

## 2023-07-06 LAB — MAGNESIUM: Magnesium: 2.2 mg/dL (ref 1.7–2.4)

## 2023-07-06 LAB — HIV ANTIBODY (ROUTINE TESTING W REFLEX): HIV Screen 4th Generation wRfx: NONREACTIVE

## 2023-07-06 MED ORDER — SODIUM CHLORIDE 0.9 % IV SOLN: INTRAVENOUS | Status: DC

## 2023-07-06 MED ORDER — SODIUM CHLORIDE 0.9% FLUSH
3.0000 mL | INTRAVENOUS | Status: DC | PRN
  Administered 2023-07-07: 10 mL via INTRAVENOUS

## 2023-07-06 MED ORDER — SODIUM CHLORIDE 0.9 % IV SOLN: 250.0000 mL | INTRAVENOUS | Status: DC | PRN

## 2023-07-06 MED ORDER — SODIUM CHLORIDE 0.9% FLUSH: 3.0000 mL | Freq: Two times a day (BID) | INTRAVENOUS | Status: DC

## 2023-07-06 NOTE — Telephone Encounter (Signed)
 Patient Product/process development scientist completed.    The patient is insured through [employer]. Patient has Medicare and is not eligible for a copay card, but may be able to apply for patient assistance or Medicare RX Payment Plan (Patient Must reach out to their plan, if eligible for payment plan), if available.    Ran test claim for dofetilide (Tikosyn) 500 mcg and the current 30 day co-pay is $10.00.   This test claim was processed through [facility]- copay amounts may vary at other pharmacies due to pharmacy/plan contracts, or as the patient moves through the different stages of their insurance plan.     Roland Earl, CPHT Pharmacy Technician III Certified Patient Advocate [facility] Patient Advocate Team Direct Number: [phone]  Fax: [phone]

## 2023-07-06 NOTE — Progress Notes (Signed)
 Morning EKG reviewed     Shows pt remains in afib with stable QTc at 440 ms.  Continue  Tikosyn 500 mcg BID.   Potassium4.1 (03/12 0414) Magnesium  2.2 (03/12 0414) Creatinine, ser  0.83 (03/12 0414)  Pt will be NPO after midnight for DCCV if remains in afib   Sherie Don, NP  07/06/2023 11:07 AM

## 2023-07-06 NOTE — Progress Notes (Addendum)
   Electrophysiology Rounding Note  Patient Name: Greg Poole Date of Encounter: 07/06/2023  Primary Cardiologist: Dietrich Pates, MD  Electrophysiologist: Lanier Prude, MD    Subjective   Pt remains in afib on Tikosyn 500 mcg BID   QTc from EKG last pm shows stable QTc at 440  The patient is doing well today.  At this time, the patient denies chest pain, shortness of breath, or any new concerns.  Inpatient Medications    Scheduled Meds:  amLODipine  5 mg Oral Daily   ascorbic acid  1,000 mg Oral Daily   atorvastatin  80 mg Oral Daily   dofetilide  500 mcg Oral BID   gabapentin  100 mg Oral TID   rivaroxaban  20 mg Oral Q supper   sodium chloride flush  3 mL Intravenous Q12H   Continuous Infusions:  sodium chloride     PRN Meds: sodium chloride, oxymetazoline, sodium chloride flush   Vital Signs    Vitals:   07/05/23 1632 07/05/23 2000  BP: 123/70 122/83  Pulse: 67 61  Resp: 16 18  Temp: 97.7 F (36.5 C) 97.8 F (36.6 C)  TempSrc: Oral Oral  SpO2: 99% 99%  Weight: 106.1 kg   Height: 6' (1.829 m)     Intake/Output Summary (Last 24 hours) at 07/06/2023 0721 Last data filed at 07/05/2023 2015 Gross per 24 hour  Intake 240 ml  Output --  Net 240 ml   Filed Weights   07/05/23 1632  Weight: 106.1 kg    Physical Exam    GEN- NAD, A&O x 3. Normal affect.  Lungs- CTAB, Normal effort.  Heart- Irregularly irregular rate and rhythm. Poole M/G/R GI- Soft, NT, ND Extremities- Poole clubbing, cyanosis, or edema Skin- Poole rash or lesion  Labs    CBC Poole results for input(s): "WBC", "NEUTROABS", "HGB", "HCT", "MCV", "PLT" in the last 72 hours. Basic Metabolic Panel Recent Labs    09/81/19 1102 07/06/23 0414  NA 135 136  K 4.1 4.1  CL 107 106  CO2 20* 26  GLUCOSE 105* 128*  BUN 16 17  CREATININE 1.02 0.83  CALCIUM 8.7* 8.8*  MG 2.3 2.2    Telemetry    AF 50-90s with rare PVCs (personally reviewed)  Patient Profile     Greg Poole is a 68 y.o.  male with a past medical history significant for persistent atrial fibrillation, HTN, TIA, HLD, CAD.  They were admitted for tikosyn load.   Assessment & Plan    Persistent atrial fibrillation Pt remains in afib on Tikosyn 500 mcg BID  Continue Xarelto Creatinine, ser  0.83 (03/12 0414) Magnesium  2.2 (03/12 0414) Potassium4.1 (03/12 0414) Poole electrolyte supplementation needed  If pt does not convert chemically, plan on DCCV Friday   2. HTN Well-controlled on 5mg  amlodipine (home med)     For questions or updates, please contact [facility] Please consult [email or web address] for contact info under Cardiology/STEMI.  Signed, Sherie Don, NP  07/06/2023, 7:21 AM

## 2023-07-06 NOTE — TOC CM/SW Note (Signed)
 Transition of Care [facility]) - Inpatient Brief Assessment   Patient Details  Name: Greg Poole MRN: [id number] Date of Birth: May 13, 1955  Transition of Care [facility]) CM/SW Contact:    Gala Lewandowsky, RN Phone Number: 07/06/2023, 3:54 PM   Clinical Narrative: Patient presented for Tikosyn Load. Case Manager spoke with the patient regarding co pay cost. Patient is agreeable to cost and would like to have the initial Rx filled via [facility] and the Rx refills 90 day supply escribed to [facility]. No further needs identified at this time.   Transition of Care Asessment: Insurance and Status: Insurance coverage has been reviewed Patient has primary care physician: Yes Home environment has been reviewed: reviewed Prior level of function:: independent Prior/Current Home Services: No current home services Social Drivers of Health Review: SDOH reviewed no interventions necessary Readmission risk has been reviewed: Yes Transition of care needs: no transition of care needs at this time

## 2023-07-06 NOTE — Progress Notes (Signed)
 Mobility Specialist Progress Note;   07/06/23 1100  Mobility  Activity Ambulated independently in hallway  Level of Assistance Modified independent, requires aide device or extra time  Assistive Device None  Distance Ambulated (ft) 400 ft  Activity Response Tolerated well  Mobility Referral Yes  Mobility visit 1 Mobility  Mobility Specialist Start Time (ACUTE ONLY) 1100  Mobility Specialist Stop Time (ACUTE ONLY) 1110  Mobility Specialist Time Calculation (min) (ACUTE ONLY) 10 min   Pt agreeable to mobility. Required no physical assistance during ambulation, ModI. HR up to 128bpm w/ activity, recovered to 79bpm once seated back in chair. No c/o when asked. Pt left in chair with all needs met.   Greg Poole Mobility Specialist Please contact via SecureChat or [employer] [phone]

## 2023-07-07 ENCOUNTER — Inpatient Hospital Stay (HOSPITAL_COMMUNITY): Admitting: Anesthesiology

## 2023-07-07 ENCOUNTER — Other Ambulatory Visit: Payer: Self-pay

## 2023-07-07 ENCOUNTER — Encounter (HOSPITAL_COMMUNITY)
Admission: AD | Disposition: A | Discharge status: Home / Self Care | Payer: Self-pay | Source: Ambulatory Visit | Attending: Cardiology

## 2023-07-07 DIAGNOSIS — I4891 Unspecified atrial fibrillation: Secondary | ICD-10-CM

## 2023-07-07 DIAGNOSIS — I10 Essential (primary) hypertension: Secondary | ICD-10-CM | POA: Diagnosis not present

## 2023-07-07 DIAGNOSIS — Z87891 Personal history of nicotine dependence: Secondary | ICD-10-CM

## 2023-07-07 HISTORY — PX: CARDIOVERSION: EP1203

## 2023-07-07 LAB — CBC
HCT: 43.7 % (ref 39.0–52.0)
Hemoglobin: 14.9 g/dL (ref 13.0–17.0)
MCH: 32.3 pg (ref 26.0–34.0)
MCHC: 34.1 g/dL (ref 30.0–36.0)
MCV: 94.8 fL (ref 80.0–100.0)
Platelets: 287 10*3/uL (ref 150–400)
RBC: 4.61 MIL/uL (ref 4.22–5.81)
RDW: 13.2 % (ref 11.5–15.5)
WBC: 9.8 10*3/uL (ref 4.0–10.5)
nRBC: 0 % (ref 0.0–0.2)

## 2023-07-07 LAB — BASIC METABOLIC PANEL
Anion gap: 10 (ref 5–15)
BUN: 19 mg/dL (ref 8–23)
CO2: 23 mmol/L (ref 22–32)
Calcium: 8.8 mg/dL — ABNORMAL LOW (ref 8.9–10.3)
Chloride: 102 mmol/L (ref 98–111)
Creatinine, Ser: 0.77 mg/dL (ref 0.61–1.24)
GFR, Estimated: >60 mL/min (ref >60)
Glucose, Bld: 109 mg/dL — ABNORMAL HIGH (ref 70–99)
Potassium: 4 mmol/L (ref 3.5–5.1)
Sodium: 135 mmol/L (ref 135–145)

## 2023-07-07 LAB — MAGNESIUM: Magnesium: 2.4 mg/dL (ref 1.7–2.4)

## 2023-07-07 SURGERY — CARDIOVERSION (CATH LAB)
Anesthesia: General

## 2023-07-07 MED ORDER — PROPOFOL 10 MG/ML IV BOLUS
INTRAVENOUS | Status: DC | PRN
  Administered 2023-07-07: 30 mg via INTRAVENOUS
  Administered 2023-07-07: 50 mg via INTRAVENOUS
  Administered 2023-07-07: 20 mg via INTRAVENOUS

## 2023-07-07 MED ORDER — LIDOCAINE 2% (20 MG/ML) 5 ML SYRINGE
INTRAMUSCULAR | Status: DC | PRN
  Administered 2023-07-07: 100 mg via INTRAVENOUS

## 2023-07-07 SURGICAL SUPPLY — 1 items: PAD DEFIB RADIO PHYSIO CONN (PAD) ×1 IMPLANT

## 2023-07-07 NOTE — Anesthesia Preprocedure Evaluation (Addendum)
 Anesthesia Evaluation  Patient identified by MRN, date of birth, ID band Patient awake    Reviewed: Allergy & Precautions, NPO status , Patient's Chart, lab work & pertinent test results  Airway Mallampati: II  TM Distance: >3 FB Neck ROM: Full    Dental no notable dental hx. (+) Teeth Intact, Dental Advisory Given   Pulmonary former smoker   Pulmonary exam normal breath sounds clear to auscultation       Cardiovascular hypertension, Normal cardiovascular exam+ dysrhythmias (xarelto) Atrial Fibrillation + Valvular Problems/Murmurs (mild/mod MR) MR  Rhythm:Irregular Rate:Normal  TTE 2024  1. Left ventricular ejection fraction, by estimation, is 50 to 55%. The  left ventricle has low normal function. The left ventricle has no regional  wall motion abnormalities. Left ventricular diastolic parameters are  indeterminate.   2. Right ventricular systolic function is low normal. The right  ventricular size is normal. There is mildly elevated pulmonary artery  systolic pressure. The estimated right ventricular systolic pressure is  38.5 mmHg.   3. Left atrial size was mildly dilated.   4. Right atrial size was mildly dilated.   5. The mitral valve is normal in structure. Mild to moderate mitral valve  regurgitation. No evidence of mitral stenosis.   6. The aortic valve is tricuspid. There is mild calcification of the  aortic valve. Aortic valve regurgitation is not visualized. No aortic  stenosis is present.   7. Aortic dilatation noted. There is mild dilatation of the ascending  aorta, measuring 38 mm.   8. The inferior vena cava is normal in size with <50% respiratory  variability, suggesting right atrial pressure of 8 mmHg.   9. The patient was in atrial fibrillation.     Neuro/Psych TIA negative psych ROS   GI/Hepatic negative GI ROS, Neg liver ROS,,,  Endo/Other  negative endocrine ROS    Renal/GU negative Renal ROS   negative genitourinary   Musculoskeletal  (+) Arthritis ,    Abdominal   Peds  Hematology negative hematology ROS (+)   Anesthesia Other Findings   Reproductive/Obstetrics                             Anesthesia Physical Anesthesia Plan  ASA: 3  Anesthesia Plan: General   Post-op Pain Management:    Induction: Intravenous  PONV Risk Score and Plan: 2 and Propofol infusion and Treatment may vary due to age or medical condition  Airway Management Planned: Natural Airway  Additional Equipment:   Intra-op Plan:   Post-operative Plan:   Informed Consent: I have reviewed the patients History and Physical, chart, labs and discussed the procedure including the risks, benefits and alternatives for the proposed anesthesia with the patient or authorized representative who has indicated his/her understanding and acceptance.     Dental advisory given  Plan Discussed with: CRNA  Anesthesia Plan Comments:        Anesthesia Quick Evaluation

## 2023-07-07 NOTE — Progress Notes (Addendum)
 When rounding on patient noted afib on the bedside monitor. EKG obtained to confirm.  Sherie Don PA notified via secure chat, stated to continue Tikosyn tonight.

## 2023-07-07 NOTE — Transfer of Care (Signed)
 Immediate Anesthesia Transfer of Care Note  Patient: Greg Poole  Procedure(s) Performed: CARDIOVERSION  Patient Location: PACU  Anesthesia Type:MAC  Level of Consciousness: awake and alert   Airway & Oxygen Therapy: Patient Spontanous Breathing  Post-op Assessment: Report given to RN  Post vital signs: Reviewed and stable  Last Vitals:  Vitals Value Taken Time  BP 142/83   Temp    Pulse 68   Resp 24   SpO2 99     Last Pain:  Vitals:   07/07/23 0713  TempSrc: Temporal  PainSc: 0-No pain      Patients Stated Pain Goal: 0 (07/06/23 0803)  Complications: No notable events documented.

## 2023-07-07 NOTE — CV Procedure (Signed)
    CARDIOVERSION NOTE  Procedure: Electrical Cardioversion Indications:  Atrial Fibrillation  Procedure Details:  Consent: Risks of procedure as well as the alternatives and risks of each were explained to the (patient/caregiver).  Consent for procedure obtained.  Time Out: Verified patient identification, verified procedure, site/side was marked, verified correct patient position, special equipment/implants available, medications/allergies/relevent history reviewed, required imaging and test results available.  Performed  Patient placed on cardiac monitor, pulse oximetry, supplemental oxygen as necessary.  Sedation given:  propofol per anesthesia Pacer pads placed anterior and posterior chest.  Cardioverted 1 time(s).  Cardioverted at 300J biphasic.  Impression: Findings: Post procedure EKG shows: NSR Complications: None Patient did tolerate procedure well.  Plan: Successful DCCV with a single 300J biphasic shock to NSR.  Time Spent Directly with the Patient:  30 minutes   Chrystie Nose, MD, [facility], FACP  [place]  [facility]  Medical Director of the [facility] Lipid Disorders &  Cardiovascular Risk Reduction Clinic Diplomate of the American Board of Clinical Lipidology Attending Cardiologist  Direct Dial: [phone]  Fax: [phone]  Website:  [email or web address] Nicely Chase Arnall 07/07/2023, 8:03 AM

## 2023-07-07 NOTE — Anesthesia Postprocedure Evaluation (Signed)
 Anesthesia Post Note  Patient: Greg Poole  Procedure(s) Performed: CARDIOVERSION     Patient location during evaluation: PACU Anesthesia Type: General Level of consciousness: awake and alert Pain management: pain level controlled Vital Signs Assessment: post-procedure vital signs reviewed and stable Respiratory status: spontaneous breathing, nonlabored ventilation, respiratory function stable and patient connected to nasal cannula oxygen Cardiovascular status: blood pressure returned to baseline and stable Postop Assessment: no apparent nausea or vomiting Anesthetic complications: no  No notable events documented.  Last Vitals:  Vitals:   07/07/23 1027 07/07/23 1415  BP: (!) 144/76 (!) 143/70  Pulse:  74  Resp:  20  Temp:  36.7 C  SpO2:      Last Pain:  Vitals:   07/07/23 1415  TempSrc: Oral  PainSc:                  [name] [name]

## 2023-07-07 NOTE — Interval H&P Note (Signed)
 History and Physical Interval Note:  07/07/2023 7:41 AM  Greg Poole  has presented today for surgery, with the diagnosis of afib.  The various methods of treatment have been discussed with the patient and family. After consideration of risks, benefits and other options for treatment, the patient has consented to  Procedure(s): CARDIOVERSION (N/A) as a surgical intervention.  The patient's history has been reviewed, patient examined, no change in status, stable for surgery.  I have reviewed the patient's chart and labs.  Questions were answered to the patient's satisfaction.     Chrystie Nose

## 2023-07-07 NOTE — Progress Notes (Signed)
 Morning EKG reviewed     Shows is in NSR s/p DCC with stable QTc at 451 ms.  Continue  Tikosyn 500 mcg BID.   Potassium4.0 (03/13 0427) Magnesium  2.4 (03/13 0427) Creatinine, ser  0.77 (03/13 0427)  Plan for home Friday if QTc remains stable   Sherie Don, NP  07/07/2023 11:12 AM

## 2023-07-07 NOTE — Progress Notes (Signed)
   Electrophysiology Rounding Note  Patient Name: Greg Poole Date of Encounter: 07/07/2023  Primary Cardiologist: Dietrich Pates, MD  Electrophysiologist: Lanier Prude, MD    Subjective   Pt converted to sinus rhythm s/p DCCV this AM on Tikosyn 500 mcg BID   QTc from EKG last pm shows stable QTc at 473  Seen in procedural area, he remains a little drowsy from DCCV. denies chest pain, shortness of breath, or any new concerns.   Inpatient Medications    Scheduled Meds:  amLODipine  5 mg Oral Daily   ascorbic acid  1,000 mg Oral Daily   atorvastatin  80 mg Oral Daily   dofetilide  500 mcg Oral BID   gabapentin  100 mg Oral TID   rivaroxaban  20 mg Oral Q supper   sodium chloride flush  3 mL Intravenous Q12H   Continuous Infusions:   PRN Meds: oxymetazoline, sodium chloride flush   Vital Signs    Vitals:   07/07/23 0810 07/07/23 0812 07/07/23 0815 07/07/23 0820  BP: 125/77 117/80 126/80 128/84  Pulse: 63 67 66 (!) 59  Resp: 18 14 11 20   Temp: (!) 97.5 F (36.4 C)     TempSrc: Temporal     SpO2: 95% 95% 97% 95%  Weight:      Height:        Intake/Output Summary (Last 24 hours) at 07/07/2023 0844 Last data filed at 07/06/2023 2015 Gross per 24 hour  Intake 240 ml  Output --  Net 240 ml   Filed Weights   07/05/23 1632  Weight: 106.1 kg    Physical Exam    GEN- NAD, A&O x 3. Normal affect.  Lungs- CTAB, Normal effort.  Heart- Regular rate and rhythm. No M/G/R GI- Soft, NT, ND Extremities- No clubbing, cyanosis, or edema Skin- no rash or lesion  Labs    CBC Recent Labs    07/07/23 0427  WBC 9.8  HGB 14.9  HCT 43.7  MCV 94.8  PLT 287   Basic Metabolic Panel Recent Labs    53/66/44 0414 07/07/23 0427  NA 136 135  K 4.1 4.0  CL 106 102  CO2 26 23  GLUCOSE 128* 109*  BUN 17 19  CREATININE 0.83 0.77  CALCIUM 8.8* 8.8*  MG 2.2 2.4    Telemetry    AF 50-90s with rare PVCs (personally reviewed)  Patient Profile     Greg Poole  is a 68 y.o. male with a past medical history significant for persistent atrial fibrillation, HTN, TIA, HLD, CAD.  They were admitted for tikosyn load.   Assessment & Plan    Persistent atrial fibrillation Pt converted to sinus rhythm s/p DCCV this AM on Tikosyn 500 mcg BID  Continue Xarelto Creatinine, ser  0.77 (03/13 0427) Magnesium  2.4 (03/13 0427) Potassium4.0 (03/13 0427) No electrolyte supplementation needed  Plan for home Friday if QTc remains stable.  2. HTN Well-controlled on 5mg  amlodipine (home med)     For questions or updates, please contact [facility] Please consult [email or web address] for contact info under Cardiology/STEMI.  Signed, Sherie Don, NP  07/07/2023, 8:44 AM

## 2023-07-07 NOTE — Plan of Care (Signed)
  Problem: Clinical Measurements: Goal: Will remain free from infection Outcome: Progressing   Problem: Clinical Measurements: Goal: Will remain free from infection Outcome: Progressing   

## 2023-07-08 ENCOUNTER — Other Ambulatory Visit: Payer: Self-pay | Admitting: Pulmonary Disease

## 2023-07-08 ENCOUNTER — Other Ambulatory Visit (HOSPITAL_COMMUNITY): Payer: Self-pay

## 2023-07-08 ENCOUNTER — Encounter (HOSPITAL_COMMUNITY): Payer: Self-pay | Admitting: Internal Medicine

## 2023-07-08 DIAGNOSIS — I4819 Other persistent atrial fibrillation: Secondary | ICD-10-CM | POA: Diagnosis not present

## 2023-07-08 LAB — MAGNESIUM: Magnesium: 2.4 mg/dL (ref 1.7–2.4)

## 2023-07-08 LAB — BASIC METABOLIC PANEL
Anion gap: 6 (ref 5–15)
BUN: 17 mg/dL (ref 8–23)
CO2: 25 mmol/L (ref 22–32)
Calcium: 8.9 mg/dL (ref 8.9–10.3)
Chloride: 103 mmol/L (ref 98–111)
Creatinine, Ser: 0.95 mg/dL (ref 0.61–1.24)
GFR, Estimated: >60 mL/min (ref >60)
Glucose, Bld: 104 mg/dL — ABNORMAL HIGH (ref 70–99)
Potassium: 4.4 mmol/L (ref 3.5–5.1)
Sodium: 134 mmol/L — ABNORMAL LOW (ref 135–145)

## 2023-07-08 MED ORDER — DOFETILIDE 500 MCG PO CAPS
500.0000 ug | ORAL_CAPSULE | Freq: Two times a day (BID) | ORAL | 11 refills | Status: DC
  Filled 2023-07-08: qty 60, 30d supply, fill #0

## 2023-07-08 NOTE — Progress Notes (Signed)
 EKG from yesterday evening 07/07/2023 reviewed     Shows pt remains in afib with stable QTc at 477 ms.  Continue  Tikosyn 500 mcg BID.   Potassium4.4 (03/14 0545) Magnesium  2.4 (03/14 0545) Creatinine, ser  0.95 (03/14 0545)  Plan for home Friday if QTc remains stable     Greg Brim, NP-C, AGACNP-BC [facility] - Electrophysiology  07/08/2023, 7:42 AM

## 2023-07-08 NOTE — Discharge Summary (Addendum)
 ELECTROPHYSIOLOGY DISCHARGE SUMMARY    Patient ID: Greg Poole,  MRN: [id number], DOB/AGE: 08/27/1955 68 y.o.  Admit date: 07/05/2023 Discharge date: 07/08/2023  Primary Care Physician: Georgann Housekeeper, MD  Primary Cardiologist: Dietrich Pates, MD  Electrophysiologist: Dr. Lalla Brothers   Primary Discharge Diagnosis:  1.  Persistent atrial fibrillation status post Tikosyn loading this admission  Secondary Discharge Diagnosis:  Hypertension   Allergies  Allergen Reactions   Duloxetine Hcl     Other Reaction(s): GI intol     Procedures This Admission:  1.  Tikosyn loading  2.  Direct current cardioversion on Thursday July 07, 2023 by Dr. Rennis Golden which successfully restored SR. There were no early apparent complications.   Brief HPI: Quentyn Kolbeck is a 68 y.o. male with a past medical history as noted above.  They were referred to EP for treatment options of atrial fibrillation.  Risks, benefits, and alternatives to Tikosyn were reviewed with the patient who wished to proceed with admission for loading.  Hospital Course:  The patient was admitted and Tikosyn was initiated.  Renal function and electrolytes were followed during the hospitalization.  Their QTc remained stable. On 07/07/23 they underwent direct current cardioversion which restored sinus rhythm. He unfortunately had ERAF post DCCV. The patients QTc remained stable. They were monitored on telemetry up to discharge. On the day of discharge, they were examined by Dr. Nelly Laurence  who considered them stable for discharge to home.  Follow-up has been arranged with the Atrial Fibrillation clinic in approximately 1 week. Will pursue repeat DCCV as outpatient.   Physical Exam: Vitals:   07/08/23 0301 07/08/23 0814 07/08/23 0856 07/08/23 0922  BP: (!) 126/51 139/67 137/86 137/86  Pulse: 61  68   Resp: 20  18   Temp: 98.2 F (36.8 C) 98.1 F (36.7 C) 97.8 F (36.6 C)   TempSrc: Oral  Oral   SpO2: 99%     Weight:      Height:         GEN- NAD, A&O x 3. Normal affect.  Lungs- CTAB, Normal effort.  Heart- Irregularly irregular rate and rhythm. No M/G/R GI- Soft, NT, ND Extremities- No clubbing, cyanosis, or edema Skin- no rash or lesion  Labs:   Lab Results  Component Value Date   WBC 9.8 07/07/2023   HGB 14.9 07/07/2023   HCT 43.7 07/07/2023   MCV 94.8 07/07/2023   PLT 287 07/07/2023    Recent Labs  Lab 07/08/23 0545  NA 134*  K 4.4  CL 103  CO2 25  BUN 17  CREATININE 0.95  CALCIUM 8.9  GLUCOSE 104*    Discharge Medications:  Allergies as of 07/08/2023       Reactions   Duloxetine Hcl    Other Reaction(s): GI intol        Medication List     TAKE these medications    amLODipine 5 MG tablet Commonly known as: NORVASC Take 5 mg by mouth daily.   ascorbic acid 1000 MG tablet Commonly known as: VITAMIN C Take 1,000 mg by mouth daily.   atorvastatin 80 MG tablet Commonly known as: LIPITOR Take 80 mg by mouth daily.   dofetilide 500 MCG capsule Commonly known as: TIKOSYN Take 1 capsule (500 mcg total) by mouth 2 (two) times daily.   oxymetazoline 0.05 % nasal spray Commonly known as: AFRIN Place 1 spray into both nostrils 2 (two) times daily as needed for congestion.   pregabalin 100 MG capsule Commonly known  as: LYRICA Take 100 mg by mouth 3 (three) times daily.   rivaroxaban 20 MG Tabs tablet Commonly known as: XARELTO Take 1 tablet (20 mg total) by mouth daily.        Disposition:  Home with follow up in AF clinic in 1 week as in AVS.   Duration of Discharge Encounter: 35 minutes APP timej   Signed, Canary Brim, NP-C, AGACNP-BC [facility] - Electrophysiology  07/08/2023, 11:31 AM

## 2023-07-08 NOTE — Progress Notes (Signed)
 Pharmacy: Dofetilide (Tikosyn) - Follow Up Assessment and Electrolyte Replacement  Pharmacy consulted to assist in monitoring and replacing electrolytes in this 68 y.o. male admitted on 07/05/2023 undergoing dofetilide initiation. First dofetilide dose: BID   Labs:    Component Value Date/Time   K 4.4 07/08/2023 0545   MG 2.4 07/08/2023 0545     Plan: Potassium: K >/= 4: No additional supplementation needed  Magnesium: Mg > 2: No additional supplementation needed   As patient has not required magnesium or  KCL replacement this admit    Leota Sauers Pharm.D. CPP, BCPS Clinical Pharmacist [phone] 07/08/2023 7:25 AM

## 2023-07-08 NOTE — Plan of Care (Signed)
 Pt is discharged to home.    Problem: Education: Goal: Knowledge of General Education information will improve Description: Including pain rating scale, medication(s)/side effects and non-pharmacologic comfort measures Outcome: Completed/Met   Problem: Health Behavior/Discharge Planning: Goal: Ability to manage health-related needs will improve Outcome: Completed/Met   Problem: Clinical Measurements: Goal: Ability to maintain clinical measurements within normal limits will improve Outcome: Completed/Met Goal: Will remain free from infection Outcome: Completed/Met Goal: Diagnostic test results will improve Outcome: Completed/Met Goal: Respiratory complications will improve Outcome: Completed/Met Goal: Cardiovascular complication will be avoided Outcome: Completed/Met   Problem: Activity: Goal: Risk for activity intolerance will decrease Outcome: Completed/Met   Problem: Nutrition: Goal: Adequate nutrition will be maintained Outcome: Completed/Met   Problem: Coping: Goal: Level of anxiety will decrease Outcome: Completed/Met   Problem: Elimination: Goal: Will not experience complications related to bowel motility Outcome: Completed/Met Goal: Will not experience complications related to urinary retention Outcome: Completed/Met   Problem: Pain Managment: Goal: General experience of comfort will improve and/or be controlled Outcome: Completed/Met   Problem: Safety: Goal: Ability to remain free from injury will improve Outcome: Completed/Met   Problem: Skin Integrity: Goal: Risk for impaired skin integrity will decrease Outcome: Completed/Met   Problem: Education: Goal: Knowledge of disease or condition will improve Outcome: Completed/Met Goal: Understanding of medication regimen will improve Outcome: Completed/Met Goal: Individualized Educational Video(s) Outcome: Completed/Met   Problem: Activity: Goal: Ability to tolerate increased activity will  improve Outcome: Completed/Met   Problem: Cardiac: Goal: Ability to achieve and maintain adequate cardiopulmonary perfusion will improve Outcome: Completed/Met   Problem: Health Behavior/Discharge Planning: Goal: Ability to safely manage health-related needs after discharge will improve Outcome: Completed/Met

## 2023-07-08 NOTE — Care Management Important Message (Signed)
 Important Message  Patient Details  Name: Greg Poole MRN: [id number] Date of Birth: [birth year]   Important Message Given:  Yes - Medicare IM     [name] 07/08/2023, 10:52 AM

## 2023-07-15 ENCOUNTER — Other Ambulatory Visit (HOSPITAL_COMMUNITY): Payer: Self-pay | Admitting: Internal Medicine

## 2023-07-15 ENCOUNTER — Ambulatory Visit (HOSPITAL_COMMUNITY)
Admit: 2023-07-15 | Discharge: 2023-07-15 | Disposition: A | Discharge status: Home / Self Care | Source: Ambulatory Visit | Attending: Internal Medicine | Admitting: Internal Medicine

## 2023-07-15 VITALS — BP 128/80 | HR 72 | Ht 72.0 in | Wt 233.2 lb

## 2023-07-15 DIAGNOSIS — I2510 Atherosclerotic heart disease of native coronary artery without angina pectoris: Secondary | ICD-10-CM | POA: Diagnosis not present

## 2023-07-15 DIAGNOSIS — D6869 Other thrombophilia: Secondary | ICD-10-CM | POA: Insufficient documentation

## 2023-07-15 DIAGNOSIS — I4819 Other persistent atrial fibrillation: Secondary | ICD-10-CM | POA: Diagnosis not present

## 2023-07-15 DIAGNOSIS — E785 Hyperlipidemia, unspecified: Secondary | ICD-10-CM | POA: Insufficient documentation

## 2023-07-15 DIAGNOSIS — Z79899 Other long term (current) drug therapy: Secondary | ICD-10-CM | POA: Diagnosis not present

## 2023-07-15 DIAGNOSIS — I10 Essential (primary) hypertension: Secondary | ICD-10-CM | POA: Insufficient documentation

## 2023-07-15 DIAGNOSIS — Z7901 Long term (current) use of anticoagulants: Secondary | ICD-10-CM | POA: Insufficient documentation

## 2023-07-15 DIAGNOSIS — Z8673 Personal history of transient ischemic attack (TIA), and cerebral infarction without residual deficits: Secondary | ICD-10-CM | POA: Insufficient documentation

## 2023-07-15 DIAGNOSIS — Z5181 Encounter for therapeutic drug level monitoring: Secondary | ICD-10-CM

## 2023-07-15 DIAGNOSIS — I4891 Unspecified atrial fibrillation: Secondary | ICD-10-CM | POA: Diagnosis present

## 2023-07-15 LAB — CBC
HCT: 44.4 % (ref 39.0–52.0)
Hemoglobin: 14.9 g/dL (ref 13.0–17.0)
MCH: 32.3 pg (ref 26.0–34.0)
MCHC: 33.6 g/dL (ref 30.0–36.0)
MCV: 96.1 fL (ref 80.0–100.0)
Platelets: 340 10*3/uL (ref 150–400)
RBC: 4.62 MIL/uL (ref 4.22–5.81)
RDW: 12.9 % (ref 11.5–15.5)
WBC: 9.7 10*3/uL (ref 4.0–10.5)
nRBC: 0 % (ref 0.0–0.2)

## 2023-07-15 LAB — BASIC METABOLIC PANEL
Anion gap: 9 (ref 5–15)
BUN: 16 mg/dL (ref 8–23)
CO2: 22 mmol/L (ref 22–32)
Calcium: 8.9 mg/dL (ref 8.9–10.3)
Chloride: 106 mmol/L (ref 98–111)
Creatinine, Ser: 0.72 mg/dL (ref 0.61–1.24)
GFR, Estimated: >60 mL/min (ref >60)
Glucose, Bld: 103 mg/dL — ABNORMAL HIGH (ref 70–99)
Potassium: 3.7 mmol/L (ref 3.5–5.1)
Sodium: 137 mmol/L (ref 135–145)

## 2023-07-15 LAB — MAGNESIUM: Magnesium: 2.1 mg/dL (ref 1.7–2.4)

## 2023-07-15 NOTE — Progress Notes (Signed)
 Primary Care Physician: Georgann Housekeeper, MD Primary Cardiologist: Dietrich Pates, MD Electrophysiologist: Lanier Prude, MD  Referring Physician: Dr Fatima Blank is a 68 y.o. male with a history of HTN, TIA, HLD, CAD, atrial fibrillation who presents for follow up in the [facility].  The patient underwent DCCV on 05/03/23 which was unsuccessful. He was seen by Dr Lalla Brothers who recommended dofetilide admission. Patient is on Xarelto for stroke prevention.   On follow up 07/15/23, patient is here for 1 week Tikosyn surveillance. S/p Tikosyn admission 3/11-14/25. S/p successful DCCV on 07/07/23. He had ERAF post DCCV. No missed doses of Tikosyn or Xarelto. He does not have cardiac awareness of Afib.   Today, he denies symptoms of palpitations, chest pain, shortness of breath, orthopnea, PND, lower extremity edema, dizziness, presyncope, syncope, snoring, daytime somnolence, bleeding, or neurologic sequela. The patient is tolerating medications without difficulties and is otherwise without complaint today.    Atrial Fibrillation Risk Factors:  he does not have symptoms or diagnosis of sleep apnea. he does not have a history of rheumatic fever.   Atrial Fibrillation Management history:  Previous antiarrhythmic drugs: Tikosyn Previous cardioversions: 05/03/23, 07/07/23 Previous ablations: none Anticoagulation history: Xarelto   ROS- All systems are reviewed and negative except as per the HPI above.  Past Medical History:  Diagnosis Date   Degeneration of lumbar intervertebral disc    Hypercholesteremia    Hyperlipidemia    Hypertension    Leukocytosis, unspecified type    Neuropathy    Obesity    Osteoarthritis of knee    Retinal artery occlusion    Tobacco use     Current Outpatient Medications  Medication Sig Dispense Refill   amLODipine (NORVASC) 5 MG tablet Take 5 mg by mouth daily.     ascorbic acid (VITAMIN C) 1000 MG tablet Take 1,000  mg by mouth daily.     atorvastatin (LIPITOR) 80 MG tablet Take 80 mg by mouth daily.     dofetilide (TIKOSYN) 500 MCG capsule Take 1 capsule (500 mcg total) by mouth 2 (two) times daily. 60 capsule 11   oxymetazoline (AFRIN) 0.05 % nasal spray Place 1 spray into both nostrils 2 (two) times daily as needed for congestion.     pregabalin (LYRICA) 100 MG capsule Take 100 mg by mouth 3 (three) times daily.  4   rivaroxaban (XARELTO) 20 MG TABS tablet Take 1 tablet (20 mg total) by mouth daily. 90 tablet 3   No current facility-administered medications for this encounter.    Physical Exam: BP 128/80   Pulse 72   Ht 6' (1.829 m)   Wt 105.8 kg   BMI 31.63 kg/m   GEN- The patient is well appearing, alert and oriented x 3 today.   Neck - no JVD or carotid bruit noted Lungs- Clear to ausculation bilaterally, normal work of breathing Heart- Irregular rate and rhythm, no murmurs, rubs or gallops, PMI not laterally displaced Extremities- no clubbing, cyanosis, or edema Skin - no rash or ecchymosis noted    Wt Readings from Last 3 Encounters:  07/15/23 105.8 kg  07/05/23 106.1 kg  07/05/23 105.2 kg     EKG today demonstrates  Vent. rate 72 BPM PR interval * ms QRS duration 86 ms QT/QTcB 466/510 ms P-R-T axes * 71 39 Atrial fibrillation with premature ventricular or aberrantly conducted complexes Prolonged QT Abnormal ECG When compared with ECG of 08-Jul-2023 10:38, PREVIOUS ECG IS PRESENT  Echo 03/10/23 demonstrated   1. Left ventricular ejection fraction, by estimation, is 50 to 55%. The  left ventricle has low normal function. The left ventricle has no regional  wall motion abnormalities. Left ventricular diastolic parameters are  indeterminate.   2. Right ventricular systolic function is low normal. The right  ventricular size is normal. There is mildly elevated pulmonary artery  systolic pressure. The estimated right ventricular systolic pressure is  38.5 mmHg.   3.  Left atrial size was mildly dilated.   4. Right atrial size was mildly dilated.   5. The mitral valve is normal in structure. Mild to moderate mitral valve  regurgitation. No evidence of mitral stenosis.   6. The aortic valve is tricuspid. There is mild calcification of the  aortic valve. Aortic valve regurgitation is not visualized. No aortic  stenosis is present.   7. Aortic dilatation noted. There is mild dilatation of the ascending  aorta, measuring 38 mm.   8. The inferior vena cava is normal in size with <50% respiratory  variability, suggesting right atrial pressure of 8 mmHg.   9. The patient was in atrial fibrillation.     CHA2DS2-VASc Score = 5  The patient's score is based upon: CHF History: 0 HTN History: 1 Diabetes History: 0 Stroke History: 2 (TIA) Vascular Disease History: 1 Age Score: 1 Gender Score: 0       ASSESSMENT AND PLAN: Persistent Atrial Fibrillation (ICD10:  I48.19) The patient's CHA2DS2-VASc score is 5, indicating a 7.2% annual risk of stroke.   S/p Tikosyn admission 3/11-14/25.  He is currently in Afib. We discussed the indication for repeat cardioversion to try to determine if patient can maintain normal rhythm on Tikosyn. We discussed the potential risks of cardioversion. After discussion, patient agrees to proceed with cardioversion. Labs drawn today.   Informed Consent   Shared Decision Making/Informed Consent The risks (stroke, cardiac arrhythmias rarely resulting in the need for a temporary or permanent pacemaker, skin irritation or burns and complications associated with conscious sedation including aspiration, arrhythmia, respiratory failure and death), benefits (restoration of normal sinus rhythm) and alternatives of a direct current cardioversion were explained in detail to Mr. Wendee Copp and he agrees to proceed.      High risk medication monitoring (ICD10: R7229428) Patient requires ongoing monitoring for anti-arrhythmic medication which has  the potential to cause life threatening arrhythmias or AV block. Qtc stable. Continue Tikosyn 500 mcg BID. Bmet and mag and CBC drawn today.   Secondary Hypercoagulable State (ICD10:  D68.69) The patient is at significant risk for stroke/thromboembolism based upon his CHA2DS2-VASc Score of 5.  Continue Rivaroxaban (Xarelto).  No missed doses.   CAD CAC score 1666 on CT PET stress test 02/2023 low risk for ischemia No chest pain. Followed by Dr Tenny Craw  HTN Stable today.   Follow up in 1 month for Tikosyn surveillance as scheduled.     Justin Mend, PA-C Afib Clinic [facility] [address] [place], [place] [address] [phone]

## 2023-07-15 NOTE — Patient Instructions (Signed)
 Cardioversion scheduled for: April. 17th 2025   - Arrive at the [facility] and go to admitting at 8:30 am   - Do not eat or drink anything after midnight the night prior to your procedure.   - Take all your morning medication (except diabetic medications) with a sip of water prior to arrival.  - You will not be able to drive home after your procedure.    - Do NOT miss any doses of your blood thinner - if you should miss a dose please notify our office immediately.   - If you feel as if you go back into normal rhythm prior to scheduled cardioversion, please notify our office immediately.   If your procedure is canceled in the cardioversion suite you will be charged a cancellation fee.         For those patients who have a scheduled procedure/anesthesia on the same day of the week as their dose, hold the medication on the day of surgery.  They can take their scheduled dose the week before.  **Patients on the above medications scheduled for elective procedures that have not held the medication for the appropriate amount of time are at risk of cancellation or change in the anesthetic plan.

## 2023-07-21 ENCOUNTER — Encounter (HOSPITAL_COMMUNITY): Payer: Self-pay

## 2023-07-21 ENCOUNTER — Other Ambulatory Visit (HOSPITAL_COMMUNITY): Payer: Self-pay | Admitting: *Deleted

## 2023-07-21 MED ORDER — POTASSIUM CHLORIDE CRYS ER 20 MEQ PO TBCR: 20.0000 meq | EXTENDED_RELEASE_TABLET | Freq: Every day | ORAL | 1 refills | Status: DC

## 2023-07-27 ENCOUNTER — Encounter: Payer: Self-pay | Admitting: Cardiology

## 2023-07-28 ENCOUNTER — Other Ambulatory Visit: Payer: Self-pay

## 2023-07-28 ENCOUNTER — Other Ambulatory Visit (HOSPITAL_COMMUNITY): Payer: Self-pay | Admitting: *Deleted

## 2023-07-28 MED ORDER — DOFETILIDE 500 MCG PO CAPS: 500.0000 ug | ORAL_CAPSULE | Freq: Two times a day (BID) | ORAL | 11 refills | Status: DC

## 2023-07-28 MED ORDER — DOFETILIDE 500 MCG PO CAPS: 500.0000 ug | ORAL_CAPSULE | Freq: Two times a day (BID) | ORAL | 1 refills | Status: DC

## 2023-08-01 ENCOUNTER — Other Ambulatory Visit (HOSPITAL_COMMUNITY): Payer: Self-pay | Admitting: *Deleted

## 2023-08-01 MED ORDER — DOFETILIDE 500 MCG PO CAPS: 500.0000 ug | ORAL_CAPSULE | Freq: Two times a day (BID) | ORAL | 0 refills | Status: DC

## 2023-08-11 ENCOUNTER — Ambulatory Visit (HOSPITAL_COMMUNITY): Admit: 2023-08-11 | Admitting: Internal Medicine

## 2023-08-11 ENCOUNTER — Encounter (HOSPITAL_COMMUNITY): Payer: Self-pay

## 2023-08-11 DIAGNOSIS — I4819 Other persistent atrial fibrillation: Secondary | ICD-10-CM

## 2023-08-11 SURGERY — CARDIOVERSION (CATH LAB)
Anesthesia: General

## 2023-08-17 NOTE — Progress Notes (Unsigned)
  Electrophysiology Office Note:   Date:  08/18/2023  ID:  Greg Poole, DOB [birth year], MRN [id number]  Primary Cardiologist: Ola Berger, MD Electrophysiologist: Boyce Byes, MD      History of Present Illness:   Greg Poole is a 68 y.o. male with h/o HTN, TIA, HLD, CAD, and PAF seen today for routine electrophysiology followup.   S/p recent Tikosyn  load admission.  Since last being seen in our clinic the patient reports doing well from a cardiac perspective. Had 1 brief episode of AF in setting of tooth infection. Noted by symptoms and on Kardia. Otherwise,   he denies chest pain, palpitations, dyspnea, PND, orthopnea, nausea, vomiting, dizziness, syncope, edema, weight gain, or early satiety.   Review of systems complete and found to be negative unless listed in HPI.   EP Information / Studies Reviewed:    EKG is ordered today. Personal review as below.       Arrhythmia/Device History No specialty comments available.   Physical Exam:   VS:  BP (!) 150/86 (BP Location: Left Arm, Patient Position: Sitting, Cuff Size: Large)   Pulse 61   Ht 6' (1.829 m)   Wt 229 lb (103.9 kg)   SpO2 96%   BMI 31.06 kg/m    Wt Readings from Last 3 Encounters:  08/18/23 229 lb (103.9 kg)  07/15/23 233 lb 3.2 oz (105.8 kg)  07/05/23 234 lb (106.1 kg)     GEN: No acute distress NECK: No JVD; No carotid bruits CARDIAC: Regular rate and rhythm, no murmurs, rubs, gallops RESPIRATORY:  Clear to auscultation without rales, wheezing or rhonchi  ABDOMEN: Soft, non-tender, non-distended EXTREMITIES:  No edema; No deformity   ASSESSMENT AND PLAN:    Persistent AF EKG today shows NSR with stable intervals Continue tikosyn  500 mcg BID Continue Xarelto  20 mg daily for CHA2DS2/VASc of at least 5  Secondary hypercoagulable state Pt on Xarelto  as above  OK to hold 2 days if needed for tooth extraction   CAD  Denies s/s ischemia  HTN Stable on current regimen    Follow up with EP APP  in 3 months  Signed, Tylene Galla, PA-C

## 2023-08-18 ENCOUNTER — Encounter: Payer: Self-pay | Admitting: Student

## 2023-08-18 ENCOUNTER — Ambulatory Visit: Attending: Student | Admitting: Student

## 2023-08-18 VITALS — BP 150/86 | HR 61 | Ht 72.0 in | Wt 229.0 lb

## 2023-08-18 DIAGNOSIS — I10 Essential (primary) hypertension: Secondary | ICD-10-CM | POA: Diagnosis not present

## 2023-08-18 DIAGNOSIS — D6869 Other thrombophilia: Secondary | ICD-10-CM | POA: Diagnosis not present

## 2023-08-18 DIAGNOSIS — I4819 Other persistent atrial fibrillation: Secondary | ICD-10-CM

## 2023-08-18 DIAGNOSIS — I2510 Atherosclerotic heart disease of native coronary artery without angina pectoris: Secondary | ICD-10-CM

## 2023-08-18 DIAGNOSIS — I2584 Coronary atherosclerosis due to calcified coronary lesion: Secondary | ICD-10-CM

## 2023-08-18 NOTE — Patient Instructions (Signed)
 Medication Instructions:  Your physician recommends that you continue on your current medications as directed. Please refer to the Current Medication list given to you today.  *If you need a refill on your cardiac medications before your next appointment, please call your pharmacy*  Lab Work: None ordered If you have labs (blood work) drawn today and your tests are completely normal, you will receive your results only by: MyChart Message (if you have MyChart) OR A paper copy in the mail If you have any lab test that is abnormal or we need to change your treatment, we will call you to review the results.  Follow-Up: At [facility], you and your health needs are our priority.  As part of our continuing mission to provide you with exceptional heart care, our providers are all part of one team.  This team includes your primary Cardiologist (physician) and Advanced Practice Providers or APPs (Physician Assistants and Nurse Practitioners) who all work together to provide you with the care you need, when you need it.  Your next appointment:   3 month(s)  Provider:   You will see one of the following Advanced Practice Providers on your designated Care Team:   Mertha Abrahams, [place] Bambi Lever "Jonelle Neri" Tillery, PA-C Creighton Doffing, NP     1st Floor: - Lobby - Registration  - Pharmacy  - Lab - Cafe  2nd Floor: - PV Lab - Diagnostic Testing (echo, CT, nuclear med)  3rd Floor: - Vacant  4th Floor: - TCTS (cardiothoracic surgery) - AFib Clinic - Structural Heart Clinic - Vascular Surgery  - Vascular Ultrasound  5th Floor: - HeartCare Cardiology (general and EP) - Clinical Pharmacy for coumadin , hypertension, lipid, weight-loss medications, and med management appointments    Valet parking services will be available as well.

## 2023-09-03 LAB — BASIC METABOLIC PANEL WITH GFR
BUN/Creatinine Ratio: 24 (ref 10–24)
BUN: 16 mg/dL (ref 8–27)
CO2: 18 mmol/L — ABNORMAL LOW (ref 20–29)
Calcium: 9.2 mg/dL (ref 8.6–10.2)
Chloride: 103 mmol/L (ref 96–106)
Creatinine, Ser: 0.68 mg/dL — ABNORMAL LOW (ref 0.76–1.27)
Glucose: 90 mg/dL (ref 70–99)
Potassium: 4.8 mmol/L (ref 3.5–5.2)
Sodium: 139 mmol/L (ref 134–144)
eGFR: 103 mL/min/{1.73_m2} (ref >59)

## 2023-09-03 LAB — MAGNESIUM: Magnesium: 2.4 mg/dL — ABNORMAL HIGH (ref 1.6–2.3)

## 2023-09-22 ENCOUNTER — Encounter: Payer: Self-pay | Admitting: Internal Medicine

## 2023-09-22 MED ORDER — RIVAROXABAN 20 MG PO TABS: 20.0000 mg | ORAL_TABLET | Freq: Every day | ORAL | 3 refills | Status: AC

## 2023-10-17 MED ORDER — POTASSIUM CHLORIDE CRYS ER 20 MEQ PO TBCR: 20.0000 meq | EXTENDED_RELEASE_TABLET | Freq: Every day | ORAL | 3 refills | Status: AC

## 2023-10-17 NOTE — Addendum Note (Signed)
 Addended by: ACQUANETTA JENKINS HERO on: 10/17/2023 10:56 AM   Modules accepted: Orders

## 2023-11-22 ENCOUNTER — Ambulatory Visit: Attending: Internal Medicine | Admitting: Student

## 2023-11-22 ENCOUNTER — Encounter: Payer: Self-pay | Admitting: Student

## 2023-11-22 VITALS — BP 110/74 | HR 63 | Ht 72.0 in | Wt 235.4 lb

## 2023-11-22 DIAGNOSIS — I4819 Other persistent atrial fibrillation: Secondary | ICD-10-CM | POA: Diagnosis not present

## 2023-11-22 DIAGNOSIS — I2584 Coronary atherosclerosis due to calcified coronary lesion: Secondary | ICD-10-CM | POA: Diagnosis not present

## 2023-11-22 DIAGNOSIS — I2510 Atherosclerotic heart disease of native coronary artery without angina pectoris: Secondary | ICD-10-CM

## 2023-11-22 DIAGNOSIS — D6869 Other thrombophilia: Secondary | ICD-10-CM

## 2023-11-22 DIAGNOSIS — I10 Essential (primary) hypertension: Secondary | ICD-10-CM

## 2023-11-22 NOTE — Patient Instructions (Signed)
 Medication Instructions:  Your physician recommends that you continue on your current medications as directed. Please refer to the Current Medication list given to you today.  *If you need a refill on your cardiac medications before your next appointment, please call your pharmacy*  Lab Work: BMET, MAG-TODAY If you have labs (blood work) drawn today and your tests are completely normal, you will receive your results only by: MyChart Message (if you have MyChart) OR A paper copy in the mail If you have any lab test that is abnormal or we need to change your treatment, we will call you to review the results.  Follow-Up: At [facility], you and your health needs are our priority.  As part of our continuing mission to provide you with exceptional heart care, our providers are all part of one team.  This team includes your primary Cardiologist (physician) and Advanced Practice Providers or APPs (Physician Assistants and Nurse Practitioners) who all work together to provide you with the care you need, when you need it.  Your next appointment:   3 month(s)  Provider:   You may see OLE ONEIDA HOLTS, MD or one of the following Advanced Practice Providers on your designated Care Team:   Charlies Arthur, [place] Ozell Jodie Passey, PA-C Suzann Riddle, NP Daphne Barrack, NP

## 2023-11-22 NOTE — Progress Notes (Signed)
  Electrophysiology Office Note:   Date:  11/22/2023  ID:  Greg Poole, DOB 10-01-1955, MRN [id number]  Primary Cardiologist: Vina Gull, MD Electrophysiologist: OLE ONEIDA HOLTS, MD   Electrophysiologist:  OLE ONEIDA HOLTS, MD      History of Present Illness:   Greg Poole is a 68 y.o. male with h/o HTN, TIA, HLD, CAD, and PAF  seen today for routine electrophysiology followup.   Since last being seen in our clinic the patient reports doing well overall. Currently, he denies chest pain, palpitations, dyspnea, PND, orthopnea, nausea, vomiting, dizziness, syncope, weight gain, or early satiety.  Has mild peripheral edema, worse in the summer and after long days standing. Uses table salt. Not on diuretic.   Review of systems complete and found to be negative unless listed in HPI.   EP Information / Studies Reviewed:    EKG is ordered today. Personal review as below.       Arrhythmia/Device History No specialty comments available.   Physical Exam:   VS:  There were no vitals taken for this visit.   Wt Readings from Last 3 Encounters:  08/18/23 229 lb (103.9 kg)  07/15/23 233 lb 3.2 oz (105.8 kg)  07/05/23 234 lb (106.1 kg)     GEN: No acute distress NECK: No JVD; No carotid bruits CARDIAC: Regular rate and rhythm, no murmurs, rubs, gallops RESPIRATORY:  Clear to auscultation without rales, wheezing or rhonchi  ABDOMEN: Soft, non-tender, non-distended EXTREMITIES:  No edema; No deformity   ASSESSMENT AND PLAN:    Persistent AF EKG today shows NSR with stable intervals Continue tikosyn  500 mcg BID Continue Xarelto  20 mg daily for CHA2DS2VASc of at least 5  Secondary hypercoagulable state Pt on Xarelto  as above   CAD No s/s of ischemia.     HTN Stable on current regimen  Peripheral edema Encouraged salt restriction Consider alternative to amlodipine  if persists. Per PCP.   Follow up with EP Team in 3 months  Signed, Ozell Prentice Passey, PA-C

## 2023-11-23 ENCOUNTER — Ambulatory Visit: Payer: Self-pay | Admitting: Student

## 2023-11-23 LAB — BASIC METABOLIC PANEL WITH GFR
BUN/Creatinine Ratio: 21 (ref 10–24)
BUN: 16 mg/dL (ref 8–27)
CO2: 19 mmol/L — ABNORMAL LOW (ref 20–29)
Calcium: 9 mg/dL (ref 8.6–10.2)
Chloride: 103 mmol/L (ref 96–106)
Creatinine, Ser: 0.78 mg/dL (ref 0.76–1.27)
Glucose: 84 mg/dL (ref 70–99)
Potassium: 4.5 mmol/L (ref 3.5–5.2)
Sodium: 137 mmol/L (ref 134–144)
eGFR: 98 mL/min/1.73 (ref >59)

## 2023-11-23 LAB — MAGNESIUM: Magnesium: 2.4 mg/dL — ABNORMAL HIGH (ref 1.6–2.3)

## 2024-01-10 ENCOUNTER — Other Ambulatory Visit (HOSPITAL_COMMUNITY): Payer: Self-pay | Admitting: Internal Medicine

## 2024-02-15 NOTE — Progress Notes (Unsigned)
  Electrophysiology Office Note:   Date:  02/16/2024  ID:  Greg Poole, DOB Apr 10, 1956, MRN [id number]  Primary Cardiologist: Vina Gull, MD Electrophysiologist: OLE ONEIDA HOLTS, MD   Electrophysiologist:  OLE ONEIDA HOLTS, MD      History of Present Illness:   Greg Poole is a 68 y.o. male with h/o HTN, TIA, HLD, CAD, and PAF seen today for routine electrophysiology followup.   Since last being seen in our clinic the patient reports doing very well. Feels like he has only had one breakthrough episode of AF since being on Tikosyn , only lasted for ~1hr. Otherwise, he denies chest pain, palpitations, dyspnea, PND, orthopnea, nausea, vomiting, dizziness, syncope, edema, weight gain, or early satiety.   Review of systems complete and found to be negative unless listed in HPI.   EP Information / Studies Reviewed:    EKG is ordered today. Personal review as below.  EKG Interpretation Date/Time:  Thursday February 16 2024 10:27:57 EDT Ventricular Rate:  68 PR Interval:  188 QRS Duration:  88 QT Interval:  432 QTC Calculation: 459 R Axis:   36  Text Interpretation: Sinus rhythm with occasional Premature ventricular complexes Confirmed by Lesia Sharper [phone]) on 02/16/2024 10:35:59 AM    Arrhythmia/Device History No specialty comments available.   Physical Exam:   VS:  BP 116/74   Pulse 68   Ht 6' (1.829 m)   Wt 241 lb 6.4 oz (109.5 kg)   SpO2 97%   BMI 32.74 kg/m    Wt Readings from Last 3 Encounters:  02/16/24 241 lb 6.4 oz (109.5 kg)  11/22/23 235 lb 6.4 oz (106.8 kg)  08/18/23 229 lb (103.9 kg)     GEN: No acute distress NECK: No JVD; No carotid bruits CARDIAC: Regular rate and rhythm, no murmurs, rubs, gallops RESPIRATORY:  Clear to auscultation without rales, wheezing or rhonchi  ABDOMEN: Soft, non-tender, non-distended EXTREMITIES:  No edema; No deformity   ASSESSMENT AND PLAN:    Persistent AF EKG today shows NSR with stable intervals. Continue  tikosyn  500 mcg BID Continue Xarelto  20 mg daily for CHA2DS2VASc of at least 5  Surveillance labs today.   Secondary hypercoagulable state Pt on Xarelto  as above   CAD No s/s of ischemia.      HTN Stable on current regimen   Peripheral edema Reinforced fluid restriction to < 2 L daily, sodium restriction to less than 2000 mg daily, and the importance of daily weights.    High risk medication monitoring - Tikosyn  Patient requires ongoing monitoring for anti-arrhythmic medication which has the potential to cause life threatening arrhythmias or AV block.    Follow up with EP Team in 4 months - then to q 6 months follow up.   Signed, Sharper Prentice Lesia, PA-C

## 2024-02-16 ENCOUNTER — Encounter: Payer: Self-pay | Admitting: Student

## 2024-02-16 ENCOUNTER — Ambulatory Visit: Attending: Internal Medicine | Admitting: Student

## 2024-02-16 VITALS — BP 116/74 | HR 68 | Ht 72.0 in | Wt 241.4 lb

## 2024-02-16 DIAGNOSIS — I10 Essential (primary) hypertension: Secondary | ICD-10-CM | POA: Diagnosis not present

## 2024-02-16 DIAGNOSIS — I2510 Atherosclerotic heart disease of native coronary artery without angina pectoris: Secondary | ICD-10-CM

## 2024-02-16 DIAGNOSIS — D6869 Other thrombophilia: Secondary | ICD-10-CM

## 2024-02-16 DIAGNOSIS — I4819 Other persistent atrial fibrillation: Secondary | ICD-10-CM | POA: Diagnosis not present

## 2024-02-16 DIAGNOSIS — I2584 Coronary atherosclerosis due to calcified coronary lesion: Secondary | ICD-10-CM

## 2024-02-16 NOTE — Patient Instructions (Signed)
 Medication Instructions:    Your physician recommends that you continue on your current medications as directed. Please refer to the Current Medication list given to you today.    *If you need a refill on your cardiac medications before your next appointment, please call your pharmacy*   Lab Work:  PLEASE GO DOWN STAIRS  [facility]  FIRST FLOOR   ( GET OFF ELEVATORS WALK TOWARDS WAITING AREA LAB LOCATED BY PHARMACY):  BMET  AND MAG  TODAY        If you have labs (blood work) drawn today and your tests are completely normal, you will receive your results only by: MyChart Message (if you have MyChart) OR A paper copy in the mail If you have any lab test that is abnormal or we need to change your treatment, we will call you to review the results.  Testing/Procedures: NONE ORDERED  TODAY    Follow-Up: At [facility], you and your health needs are our priority.  As part of our continuing mission to provide you with exceptional heart care, our providers are all part of one team.  This team includes your primary Cardiologist (physician) and Advanced Practice Providers or APPs (Physician Assistants and Nurse Practitioners) who all work together to provide you with the care you need, when you need it.  Your next appointment:   4 month(s)   Provider:    Ozell Jodie Passey, PA-C    We recommend signing up for the patient portal called MyChart.  Sign up information is provided on this After Visit Summary.  MyChart is used to connect with patients for Virtual Visits (Telemedicine).  Patients are able to view lab/test results, encounter notes, upcoming appointments, etc.  Non-urgent messages can be sent to your provider as well.   To learn more about what you can do with MyChart, go to [email or web address].   Other Instructions

## 2024-02-17 ENCOUNTER — Ambulatory Visit: Payer: Self-pay | Admitting: Student

## 2024-02-17 LAB — BASIC METABOLIC PANEL WITH GFR
BUN/Creatinine Ratio: 17 (ref 10–24)
BUN: 12 mg/dL (ref 8–27)
CO2: 20 mmol/L (ref 20–29)
Calcium: 9.2 mg/dL (ref 8.6–10.2)
Chloride: 100 mmol/L (ref 96–106)
Creatinine, Ser: 0.72 mg/dL — ABNORMAL LOW (ref 0.76–1.27)
Glucose: 103 mg/dL — ABNORMAL HIGH (ref 70–99)
Potassium: 4.4 mmol/L (ref 3.5–5.2)
Sodium: 136 mmol/L (ref 134–144)
eGFR: 100 mL/min/1.73 (ref >59)

## 2024-02-17 LAB — MAGNESIUM: Magnesium: 2.2 mg/dL (ref 1.6–2.3)

## 2024-05-25 ENCOUNTER — Telehealth (HOSPITAL_BASED_OUTPATIENT_CLINIC_OR_DEPARTMENT_OTHER): Payer: Self-pay

## 2024-05-25 NOTE — Telephone Encounter (Signed)
"  ° °  Pre-operative Risk Assessment    Patient Name: Greg Poole  DOB: January 08, 1956 MRN: [id number]   Date of last office visit: 02/16/2024 with Ozell Prentice Passey, PA-C Date of next office visit: 06/20/2024 with Ozell Prentice Passey, PA-C  Request for Surgical Clearance     Procedure:  colonoscopy  Date of Surgery:  Clearance 06/14/24                                 Surgeon:  Dr. Elspeth Jungling Surgeon's Group or Practice Name:  [facility] Phone number:  [phone] Fax number:  [phone]   Type of Clearance Requested:   - Medical  - Pharmacy:  Hold Rivaroxaban  (Xarelto )  -Need to be held preoperatively for the indicated times in accordance with ASGE Guidelines.  -Please calculate the patient's creatine clearance then choose the appropriate number of days for patient to hold. Xarelto , CrCl (mL/min) 60-90 - HOLD 2 days Xarelto , CrCl (mL/min) 30-59 - HOLD 3 days Xarelto , CrCl (mL/min) 15-20 - HOLD 4 days  If you determine that your patient needs bridging with enoxaparin while off of their Coumadin  or other anticoagulant, please provide orders and instructions to the patient. Please instruct the patient to stop the enoxaparin 24 hours prior to procedure. [facility] is not responsible for providing instructions or medications to the patient.  If a patient undergoes a complex polypectomy, blood thinning medications will be held at least 5 days post procedure.  If anticoagulant(s) must be continued, or you disagree with the above recommendations, please indicate rationale.   (A GI physician will review relative risks/benefits of performing the requested procedure. The procedure request may be deferred or declined based on this review)    Type of Anesthesia:  Not Indicated (Propofol ?)   Additional requests/questions:  None  Signed, Patrcia Iverson CROME   05/25/2024, 4:37 PM   "

## 2024-05-28 NOTE — Telephone Encounter (Signed)
Left message for pt to call back to schedule tele pre  op appt.

## 2024-05-28 NOTE — Telephone Encounter (Signed)
 Please advise holding Xarelto  prior to colonoscopy on 2/19 Last labs October 2025  Thank you!  DW

## 2024-05-28 NOTE — Telephone Encounter (Signed)
" ° °  Name: Greg Poole  DOB: 12/06/1955  MRN: [id number]  Primary Cardiologist: Vina Gull, MD   Preoperative team, please contact this patient and set up a phone call appointment for further preoperative risk assessment. Please obtain consent and complete medication review. Thank you for your help.  I confirm that guidance regarding antiplatelet and oral anticoagulation therapy has been completed and, if necessary, noted below.  Per Pharm D, patient has not had an Afib/aflutter ablation within the last 3 months, DCCV within the last 4 weeks, or Watchman in the last 45 days. Patient may hold Xarelto  for 2 days prior to procedure.  Patient will not need bridging with Lovenox around procedure.    I also confirmed the patient resides in the state of [place] . As per [facility] Medical Board telemedicine laws, the patient must reside in the state in which the provider is licensed.    Barnie Hila, NP 05/28/2024, 4:03 PM [facility]     "

## 2024-06-05 ENCOUNTER — Ambulatory Visit: Admitting: Physician Assistant

## 2024-06-14 ENCOUNTER — Ambulatory Visit: Admit: 2024-06-14 | Admitting: Gastroenterology

## 2024-06-20 ENCOUNTER — Ambulatory Visit: Admitting: Student
